# Patient Record
Sex: Male | Born: 1980
Health system: Southern US, Community
[De-identification: ages and names within clinical notes are randomized; demographics above are authoritative.]

## PROBLEM LIST (undated history)

## (undated) HISTORY — PX: NO PAST SURGERIES: SHX2092

## (undated) HISTORY — PX: VASECTOMY: SHX75

---

## 2008-01-04 ENCOUNTER — Ambulatory Visit: Payer: Self-pay | Admitting: Family Medicine

## 2008-01-04 DIAGNOSIS — J069 Acute upper respiratory infection, unspecified: Secondary | ICD-10-CM | POA: Insufficient documentation

## 2008-01-04 DIAGNOSIS — R5383 Other fatigue: Secondary | ICD-10-CM

## 2008-01-04 DIAGNOSIS — R05 Cough: Secondary | ICD-10-CM

## 2008-01-04 DIAGNOSIS — R5381 Other malaise: Secondary | ICD-10-CM

## 2008-01-05 ENCOUNTER — Encounter: Payer: Self-pay | Admitting: Family Medicine

## 2008-01-05 DIAGNOSIS — E291 Testicular hypofunction: Secondary | ICD-10-CM | POA: Insufficient documentation

## 2008-01-05 LAB — CONVERTED CEMR LAB
Basophils Relative: 0 % (ref 0–1)
Eosinophils Absolute: 0 10*3/uL (ref 0.0–0.7)
Eosinophils Relative: 1 % (ref 0–5)
HCT: 47 % (ref 39.0–52.0)
Hemoglobin: 15.5 g/dL (ref 13.0–17.0)
MCHC: 33 g/dL (ref 30.0–36.0)
MCV: 91.3 fL (ref 78.0–100.0)
Monocytes Absolute: 0.8 10*3/uL (ref 0.1–1.0)
Monocytes Relative: 13 % — ABNORMAL HIGH (ref 3–12)
Neutrophils Relative %: 70 % (ref 43–77)
RBC: 5.15 M/uL (ref 4.22–5.81)

## 2008-05-14 ENCOUNTER — Telehealth: Payer: Self-pay | Admitting: Family Medicine

## 2008-05-27 ENCOUNTER — Emergency Department (HOSPITAL_COMMUNITY): Admission: EM | Admit: 2008-05-27 | Discharge: 2008-05-27 | Payer: Self-pay | Admitting: Family Medicine

## 2008-12-13 ENCOUNTER — Ambulatory Visit: Payer: Self-pay | Admitting: Family Medicine

## 2008-12-13 DIAGNOSIS — B350 Tinea barbae and tinea capitis: Secondary | ICD-10-CM | POA: Insufficient documentation

## 2008-12-14 LAB — CONVERTED CEMR LAB
ALT: 26 units/L (ref 0–53)
Albumin: 4.6 g/dL (ref 3.5–5.2)
CO2: 24 meq/L (ref 19–32)
Chloride: 104 meq/L (ref 96–112)
Cholesterol: 145 mg/dL (ref 0–200)
Glucose, Bld: 85 mg/dL (ref 70–99)
Potassium: 4.3 meq/L (ref 3.5–5.3)
Sodium: 140 meq/L (ref 135–145)
Testosterone-% Free: 2.8 % (ref 1.6–2.9)
Total Protein: 8.1 g/dL (ref 6.0–8.3)
Triglycerides: 77 mg/dL (ref <150)

## 2009-01-22 ENCOUNTER — Ambulatory Visit: Payer: Self-pay | Admitting: Family Medicine

## 2009-01-22 DIAGNOSIS — R300 Dysuria: Secondary | ICD-10-CM

## 2009-01-22 LAB — CONVERTED CEMR LAB
Glucose, Urine, Semiquant: NEGATIVE
Ketones, urine, test strip: NEGATIVE
Protein, U semiquant: NEGATIVE
Specific Gravity, Urine: 1.025
Urobilinogen, UA: 0.2
WBC Urine, dipstick: NEGATIVE

## 2010-09-15 ENCOUNTER — Telehealth: Payer: Self-pay | Admitting: Family Medicine

## 2010-09-17 ENCOUNTER — Encounter: Payer: Self-pay | Admitting: Family Medicine

## 2010-09-18 ENCOUNTER — Ambulatory Visit: Payer: Self-pay | Admitting: Family Medicine

## 2010-09-25 NOTE — Progress Notes (Signed)
  Phone Note Call from Patient   Caller: Spouse Call For: [phone] Summary of Call: wife left message on vm stating pt has has chest pain on and off at least once a week for one month.Pt states pt is not currently having any chest pain and is fine and at work right now. Initial call taken by: Avon Gully CMA, Duncan Dull),  September 15, 2010 4:28 PM  Follow-up for Phone Call        called wife back and there was no answer but left a message on wifes vm stating if chest pain occurs before appt time of if new sxs developed and /or accompinied by chest pain then to go to ED. Follow-up by: Avon Gully CMA, Duncan Dull),  September 15, 2010 4:28 PM

## 2010-11-18 ENCOUNTER — Ambulatory Visit (INDEPENDENT_AMBULATORY_CARE_PROVIDER_SITE_OTHER): Payer: 59 | Admitting: Family Medicine

## 2010-11-18 ENCOUNTER — Encounter: Payer: Self-pay | Admitting: Family Medicine

## 2010-11-18 VITALS — BP 122/70 | HR 92 | Temp 98.4°F | Ht 69.25 in | Wt 189.0 lb

## 2010-11-18 DIAGNOSIS — J329 Chronic sinusitis, unspecified: Secondary | ICD-10-CM

## 2010-11-18 DIAGNOSIS — J029 Acute pharyngitis, unspecified: Secondary | ICD-10-CM

## 2010-11-18 LAB — POCT RAPID STREP A (OFFICE): Rapid Strep A Screen: NEGATIVE

## 2010-11-18 NOTE — Progress Notes (Signed)
  Subjective:    Patient ID: John Steele, male    DOB: 26-Apr-1981, 30 y.o.   MRN: [id number]  HPI  Sinsus pressure and pain. Slight cough. Throat feels tight in the AM. Today actually feels a little better. Sxs for about 4 days. Frontal HA.  Nose sxs are better. Nasal congestion is better.  Had a fever yesterday to 102. No fever today. Took IBU and some tylenol as well.   Review of Systems     Objective:   Physical Exam  Constitutional: He appears well-developed and well-nourished.  HENT:  Head: Normocephalic and atraumatic.  Right Ear: External ear normal.  Left Ear: External ear normal.  Nose: Nose normal.  Mouth/Throat: Oropharynx is clear and moist.       White spots on the right tonsil  Eyes: Conjunctivae are normal.       PER  Neck: Neck supple. No thyromegaly present.  Cardiovascular: Normal rate, regular rhythm and normal heart sounds.   Pulmonary/Chest: Effort normal and breath sounds normal.  Lymphadenopathy:    He has no cervical adenopathy.  Skin: Skin is warm and dry.  Psychiatric: He has a normal mood and affect.          Assessment & Plan:  URI - rapdi strep neg. Likely viral. Call if not better in 1 weeks.  Call if suddenly gets worse or fever recurs  Symptomatic care.

## 2011-10-22 ENCOUNTER — Ambulatory Visit (INDEPENDENT_AMBULATORY_CARE_PROVIDER_SITE_OTHER): Payer: 59 | Admitting: Family Medicine

## 2011-10-22 ENCOUNTER — Encounter: Payer: Self-pay | Admitting: Family Medicine

## 2011-10-22 DIAGNOSIS — K649 Unspecified hemorrhoids: Secondary | ICD-10-CM | POA: Insufficient documentation

## 2011-10-22 MED ORDER — HYDROCORTISONE 2.5 % RE CREA: TOPICAL_CREAM | Freq: Two times a day (BID) | RECTAL | Status: AC

## 2011-10-22 NOTE — Progress Notes (Signed)
  Subjective:    Patient ID: John Steele, male    DOB: 1980-07-07, 31 y.o.   MRN: [id number]  HPI Thinks has a hemorrhoid. Was painful about 8 days ago. Tried some IBU and topical cream.  He took ibuprofen was somewhat helpful but the over-the-counter hemorrhoid cream was not. He says he went on to [facility]. and started increasing the fiber and water intake in his diet.  Says it is getting better today.  Has noticed some bleeding from it.  No constipation but says did have to strain a little more last week.    Review of Systems     Objective:   Physical Exam  Constitutional: He is oriented to person, place, and time. He appears well-developed and well-nourished.  Genitourinary:       Rectum with normal tone. He does have a hemorrhoid at the 12 o'clock position that is mildy inflammed with a scab on it.  No sign of clot.  No rectal masses.  Hemeoccult is neg.   Neurological: He is alert and oriented to person, place, and time.  Psychiatric: He has a normal mood and affect. His behavior is normal.       Assessment & Plan:  Hemorrhoid - discussed working on keeping the stool soft to avoid straining avoid constipation. Also sometimes diarrhea can aggravate hemorrhoids. He can continue the topical cream, Anusol sent to pharm,  until it completely improved. If he has any recurrence but he is unable to control in his own home he was welcome to come back in. If he needs to he can also take a stool softener it is just increasing the fiber in his diet increasing water intake is not relieve his symptoms.

## 2011-10-22 NOTE — Patient Instructions (Signed)

## 2012-08-26 ENCOUNTER — Encounter: Payer: 59 | Admitting: Family Medicine

## 2012-08-31 ENCOUNTER — Encounter: Payer: Self-pay | Admitting: Family Medicine

## 2012-08-31 ENCOUNTER — Ambulatory Visit (INDEPENDENT_AMBULATORY_CARE_PROVIDER_SITE_OTHER): Payer: 59 | Admitting: Family Medicine

## 2012-08-31 VITALS — BP 120/74 | HR 70 | Ht 69.6 in | Wt 195.0 lb

## 2012-08-31 DIAGNOSIS — Z0000 Encounter for general adult medical examination without abnormal findings: Secondary | ICD-10-CM

## 2012-08-31 DIAGNOSIS — E291 Testicular hypofunction: Secondary | ICD-10-CM

## 2012-08-31 MED ORDER — TETANUS-DIPHTH-ACELL PERTUSSIS 5-2.5-18.5 LF-MCG/0.5 IM SUSP: 0.5000 mL | Freq: Once | INTRAMUSCULAR | Status: DC

## 2012-08-31 NOTE — Progress Notes (Signed)
  Subjective:    Patient ID: John Steele, male    DOB: [birth year], 32 y.o.   MRN: [id number]  HPI Here for complete physical today. He has no specific concerns. The he would like his testosterone rechecked. We tested it a couple years ago and it was borderline low. He saw a commercial and felt he was having a coupl of symptoms that might be related to his testosterone. He does have some keloids in his schedule an appointment with one symptomatology.   Review of Systems Comprehensive review of systems is negative. BP 120/74  Pulse 70  Ht 5' 9.6" (1.768 m)  Wt 195 lb (88.451 kg)  BMI 28.3 kg/m2    No Known Allergies  No past medical history on file.  Past Surgical History  Procedure Laterality Date  . No past surgeries      History   Social History  . Marital Status: Married    Spouse Name: N/A    Number of Children: N/A  . Years of Education: N/A   Occupational History  . Adult nurse   Social History Main Topics  . Smoking status: Light Tobacco Smoker    Types: Cigars  . Smokeless tobacco: Not on file  . Alcohol Use: 3.6 oz/week    6 Cans of beer per week  . Drug Use: Not on file  . Sexually Active: Yes -- Male partner(s)   Other Topics Concern  . Not on file   Social History Narrative   No regular exercise.     Family History  Problem Relation Age of Onset  . Hypertension Mother   . Diabetes Father     No outpatient encounter prescriptions on file as of 08/31/2012.   No facility-administered encounter medications on file as of 08/31/2012.          Objective:   Physical Exam  Constitutional: He is oriented to person, place, and time. He appears well-developed and well-nourished.  HENT:  Head: Normocephalic and atraumatic.  Right Ear: External ear normal.  Left Ear: External ear normal.  Nose: Nose normal.  Mouth/Throat: Oropharynx is clear and moist.  Eyes: Conjunctivae and EOM are normal. Pupils are equal, round, and reactive  to light.  Neck: Normal range of motion. Neck supple. No thyromegaly present.  Cardiovascular: Normal rate, regular rhythm, normal heart sounds and intact distal pulses.   Pulmonary/Chest: Effort normal and breath sounds normal.  Abdominal: Soft. Bowel sounds are normal. He exhibits no distension and no mass. There is no tenderness. There is no rebound and no guarding.  Musculoskeletal: Normal range of motion.  Lymphadenopathy:    He has no cervical adenopathy.  Neurological: He is alert and oriented to person, place, and time. He has normal reflexes.  Skin: Skin is warm and dry.  Psychiatric: He has a normal mood and affect. His behavior is normal. Judgment and thought content normal.          Assessment & Plan:  CPE -  Overall he is doing well. Due for screening labwork including CMP, lipid panel. We'll recheck testosterone level since previous levels were borderline. Encourage regular exercise and healthy diet. Tdap updated today. He did not get a flu shot this year. Blood pressure looks fantastic today. Followup in one year.

## 2012-08-31 NOTE — Patient Instructions (Addendum)
Keep up a regular exercise program and make sure you are eating a healthy diet Try to eat 4 servings of dairy a day, or if you are lactose intolerant take a calcium with vitamin D daily.  Your vaccines are up to date.   

## 2013-09-13 ENCOUNTER — Ambulatory Visit (INDEPENDENT_AMBULATORY_CARE_PROVIDER_SITE_OTHER): Payer: 59 | Admitting: Family Medicine

## 2013-09-13 ENCOUNTER — Encounter: Payer: Self-pay | Admitting: Family Medicine

## 2013-09-13 VITALS — BP 133/78 | HR 70 | Ht 70.0 in | Wt 196.0 lb

## 2013-09-13 DIAGNOSIS — Z0000 Encounter for general adult medical examination without abnormal findings: Secondary | ICD-10-CM

## 2013-09-13 DIAGNOSIS — Z1211 Encounter for screening for malignant neoplasm of colon: Secondary | ICD-10-CM

## 2013-09-13 DIAGNOSIS — Z8042 Family history of malignant neoplasm of prostate: Secondary | ICD-10-CM | POA: Insufficient documentation

## 2013-09-13 LAB — POC HEMOCCULT BLD/STL (OFFICE/1-CARD/DIAGNOSTIC): Fecal Occult Blood, POC: NEGATIVE

## 2013-09-13 NOTE — Progress Notes (Signed)
   Subjective:    Patient ID: John Steele, male    DOB: 1981/01/11, 33 y.o.   MRN: [id number]  HPI Here for CPE- Not wanting to quit smoking.  No regular exercise.  He would like to have a PSA and his prostate examined because his father had prostate cancer at 5045. He's completely asymptomatic at this time. He's not having any urinary symptoms.    Review of Systems Comprehensive review of systems is negative.  BP 133/78  Pulse 70  Ht 5\' 10"  (1.778 m)  Wt 196 lb (88.905 kg)  BMI 28.12 kg/m2    No Known Allergies  History reviewed. No pertinent past medical history.  Past Surgical History  Procedure Laterality Date  . No past surgeries      History   Social History  . Marital Status: Married    Spouse Name: N/A    Number of Children: N/A  . 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 Smoking status: Light Tobacco Smoker    Types: Cigars  . Smokeless tobacco: Not on file  . Alcohol Use: 3.6 oz/week    6 Cans of beer per week  . Drug Use: Not on file  . Sexual Activity: Yes    Partners: Female   Other Topics Concern  . Not on file   Social History Narrative   No regular exercise.     Family History  Problem Relation Age of Onset  . Hypertension Mother   . Diabetes Father   . Prostate cancer Father [phone]    No outpatient encounter prescriptions on file as of 09/13/2013.          Objective:   Physical Exam  Constitutional: He is oriented to person, place, and time. He appears well-developed and well-nourished.  HENT:  Head: Normocephalic and atraumatic.  Right Ear: External ear normal.  Left Ear: External ear normal.  Nose: Nose normal.  Mouth/Throat: Oropharynx is clear and moist.  Eyes: Conjunctivae and EOM are normal. Pupils are equal, round, and reactive to light.  Neck: Normal range of motion. Neck supple. No thyromegaly present.  Cardiovascular: Normal rate, regular rhythm,  normal heart sounds and intact distal pulses.   Pulmonary/Chest: Effort normal and breath sounds normal.  Abdominal: Soft. Bowel sounds are normal. He exhibits no distension and no mass. There is no tenderness. There is no rebound and no guarding.  Musculoskeletal: Normal range of motion.  Lymphadenopathy:    He has no cervical adenopathy.  Neurological: He is alert and oriented to person, place, and time. He has normal reflexes.  Skin: Skin is warm and dry.  Psychiatric: He has a normal mood and affect. His behavior is normal. Judgment and thought content normal.          Assessment & Plan:  CPE Keep up a regular exercise program and make sure you are eating a healthy diet Try to eat 4 servings of dairy a day, or if you are lactose intolerant take a calcium with vitamin D daily.  Your vaccines are up to date.   Smoker - offered cessaion counseling. He's not just in quitting smoking at this time. He also declines a pneumonia vaccine.  Family history of prostate cancer-will check PSA.

## 2013-09-13 NOTE — Patient Instructions (Signed)
Keep up a regular exercise program and make sure you are eating a healthy diet Try to eat 4 servings of dairy a day, or if you are lactose intolerant take a calcium with vitamin D daily.  Your vaccines are up to date.   

## 2014-01-17 ENCOUNTER — Other Ambulatory Visit: Payer: Self-pay | Admitting: *Deleted

## 2014-01-17 DIAGNOSIS — Z0000 Encounter for general adult medical examination without abnormal findings: Secondary | ICD-10-CM

## 2014-01-17 DIAGNOSIS — E291 Testicular hypofunction: Secondary | ICD-10-CM

## 2014-02-27 ENCOUNTER — Encounter: Payer: Self-pay | Admitting: Sports Medicine

## 2014-02-27 ENCOUNTER — Ambulatory Visit (INDEPENDENT_AMBULATORY_CARE_PROVIDER_SITE_OTHER): Payer: 59 | Admitting: Sports Medicine

## 2014-02-27 ENCOUNTER — Ambulatory Visit (INDEPENDENT_AMBULATORY_CARE_PROVIDER_SITE_OTHER): Payer: 59

## 2014-02-27 VITALS — BP 124/64 | HR 71 | Ht 70.0 in | Wt 193.0 lb

## 2014-02-27 DIAGNOSIS — M545 Low back pain, unspecified: Secondary | ICD-10-CM

## 2014-02-27 DIAGNOSIS — M5136 Other intervertebral disc degeneration, lumbar region: Secondary | ICD-10-CM | POA: Insufficient documentation

## 2014-02-27 DIAGNOSIS — S335XXA Sprain of ligaments of lumbar spine, initial encounter: Secondary | ICD-10-CM

## 2014-02-27 DIAGNOSIS — S39012A Strain of muscle, fascia and tendon of lower back, initial encounter: Secondary | ICD-10-CM

## 2014-02-27 DIAGNOSIS — M51369 Other intervertebral disc degeneration, lumbar region without mention of lumbar back pain or lower extremity pain: Secondary | ICD-10-CM | POA: Insufficient documentation

## 2014-02-27 MED ORDER — MELOXICAM 15 MG PO TABS: ORAL_TABLET | ORAL | Status: DC
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
## 2014-03-09 ENCOUNTER — Ambulatory Visit (INDEPENDENT_AMBULATORY_CARE_PROVIDER_SITE_OTHER): Payer: 59 | Admitting: Family Medicine

## 2014-03-09 ENCOUNTER — Encounter: Payer: Self-pay | Admitting: Family Medicine

## 2014-03-09 VITALS — BP 138/84 | HR 104 | Temp 99.5°F | Ht 70.0 in | Wt 190.0 lb

## 2014-03-09 DIAGNOSIS — R05 Cough: Secondary | ICD-10-CM

## 2014-03-09 DIAGNOSIS — J019 Acute sinusitis, unspecified: Secondary | ICD-10-CM

## 2014-03-09 DIAGNOSIS — R059 Cough, unspecified: Secondary | ICD-10-CM

## 2014-03-09 MED ORDER — AMOXICILLIN-POT CLAVULANATE 875-125 MG PO TABS: 1.0000 | ORAL_TABLET | Freq: Two times a day (BID) | ORAL | Status: DC

## 2014-03-09 MED ORDER — ALBUTEROL SULFATE (2.5 MG/3ML) 0.083% IN NEBU: 2.5000 mg | INHALATION_SOLUTION | Freq: Once | RESPIRATORY_TRACT | Status: DC

## 2014-03-09 NOTE — Progress Notes (Signed)
   Subjective:    Patient ID: John Steele, male    DOB: Oct 09, 1980, 33 y.o.   MRN: [id number]  HPI Sinus pain and pressure around the right eye x 3 days.  sxs began [place] he has been using mucinex and saline rinse not helping he reports feeling tightness in chest and cough.  No fever.  + chills.  Hx of asthma as a child.    Has felt SOB.  Cough is dry. Mild ST.     Review of Systems     Objective:   Physical Exam  Constitutional: He is oriented to person, place, and time. He appears well-developed and well-nourished.  HENT:  Head: Normocephalic and atraumatic.  Right Ear: External ear normal.  Left Ear: External ear normal.  Nose: Nose normal.  Mouth/Throat: Oropharynx is clear and moist.  TMs and canals are clear.   Eyes: Conjunctivae and EOM are normal. Pupils are equal, round, and reactive to light.  Neck: Neck supple. No thyromegaly present.  Cardiovascular: Normal rate and normal heart sounds.   Pulmonary/Chest: Effort normal and breath sounds normal.  Lymphadenopathy:    He has no cervical adenopathy.  Neurological: He is alert and oriented to person, place, and time.  Skin: Skin is warm and dry.  Psychiatric: He has a normal mood and affect.          Assessment & Plan:  Acute sinusitis-will treat with Augmentin. Call if not significantly better in one week. Work on hydration and symptomatic care.

## 2015-04-02 ENCOUNTER — Ambulatory Visit (INDEPENDENT_AMBULATORY_CARE_PROVIDER_SITE_OTHER): Payer: BLUE CROSS/BLUE SHIELD | Admitting: Family Medicine

## 2015-04-02 ENCOUNTER — Encounter: Payer: Self-pay | Admitting: Family Medicine

## 2015-04-02 VITALS — BP 133/74 | HR 74 | Temp 98.6°F | Ht 70.0 in | Wt 206.0 lb

## 2015-04-02 DIAGNOSIS — J029 Acute pharyngitis, unspecified: Secondary | ICD-10-CM

## 2015-04-02 DIAGNOSIS — J069 Acute upper respiratory infection, unspecified: Secondary | ICD-10-CM | POA: Diagnosis not present

## 2015-04-02 LAB — POCT RAPID STREP A (OFFICE): RAPID STREP A SCREEN: NEGATIVE

## 2015-04-02 NOTE — Progress Notes (Signed)
   Subjective:    Patient ID: John Steele, male    DOB: Nov 19, 1980, 34 y.o.   MRN: [id number]  HPI Sinus congestion and cough x 1 week. Says took some old amoxicillin.  Throat was really scratchey over the weekend.  Using Ricola and mucinex. Says woke up feeling like it was hard to talk.  No fever, chills. He is very worried about strep throat.    Review of Systems     Objective:   Physical Exam  Constitutional: He is oriented to person, place, and time. He appears well-developed and well-nourished.  HENT:  Head: Normocephalic and atraumatic.  Right Ear: External ear normal.  Left Ear: External ear normal.  Nose: Nose normal.  Mouth/Throat: Oropharynx is clear and moist.  TMs and canals are clear.   Eyes: Conjunctivae and EOM are normal. Pupils are equal, round, and reactive to light.  Neck: Neck supple. No thyromegaly present.  Cardiovascular: Normal rate and normal heart sounds.   Pulmonary/Chest: Effort normal and breath sounds normal.  Lymphadenopathy:    He has no cervical adenopathy.  Neurological: He is alert and oriented to person, place, and time.  Skin: Skin is warm and dry.  Psychiatric: He has a normal mood and affect.          Assessment & Plan:  URI - likely viral. Call if not better in 3-4 days. Strep is neg. recommend symptomatic care.

## 2015-04-02 NOTE — Patient Instructions (Signed)
Recommend salt water gargles to 3 times a day. Also recommend warm herbal tea with honey unit to help coat the throat. Using a humidifier at night can help as well as specially on keeping the throat from getting dry and irritated. Call if not feeling some better in the next 3-4 days.

## 2015-04-17 ENCOUNTER — Encounter: Payer: Self-pay | Admitting: Family Medicine

## 2015-04-17 ENCOUNTER — Ambulatory Visit (INDEPENDENT_AMBULATORY_CARE_PROVIDER_SITE_OTHER): Payer: BLUE CROSS/BLUE SHIELD | Admitting: Family Medicine

## 2015-04-17 VITALS — BP 148/83 | HR 78 | Temp 98.5°F | Resp 18 | Wt 204.8 lb

## 2015-04-17 DIAGNOSIS — Z0000 Encounter for general adult medical examination without abnormal findings: Secondary | ICD-10-CM | POA: Diagnosis not present

## 2015-04-17 DIAGNOSIS — E291 Testicular hypofunction: Secondary | ICD-10-CM | POA: Diagnosis not present

## 2015-04-17 NOTE — Progress Notes (Signed)
   Subjective:    Patient ID: John Steele, male    DOB: 04/09/1981, 34 y.o.   MRN: [id number]  HPI Here for CPE today. No specific concerns or complaints.    Review of Systems Comprehensive ROS is negative.   BP 148/83 mmHg  Pulse 78  Temp(Src) 98.5 F (36.9 C) (Oral)  Resp 18  Wt 204 lb 12.8 oz (92.897 kg)  SpO2 98%    No Known Allergies  No past medical history on file.  Past Surgical History  Procedure Laterality Date  . No past surgeries      Social History   Social History  . Marital Status: Married    Spouse Name: N/A  . Number of Children: N/A  . 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 Smoking status: Former Smoker    Types: Cigars  . Smokeless tobacco: Not on file  . Alcohol Use: 3.6 oz/week    6 Cans of beer per week  . Drug Use: Not on file  . Sexual Activity:    Partners: Female   Other Topics Concern  . Not on file   Social History Narrative   No regular exercise.     Family History  Problem Relation Age of Onset  . Hypertension Mother   . Diabetes Father   . Prostate cancer Father [phone]    No outpatient encounter prescriptions on file as of 04/17/2015.   No facility-administered encounter medications on file as of 04/17/2015.          Objective:   Physical Exam  Constitutional: He is oriented to person, place, and time. He appears well-developed and well-nourished.  HENT:  Head: Normocephalic and atraumatic.  Right Ear: External ear normal.  Left Ear: External ear normal.  Nose: Nose normal.  Mouth/Throat: Oropharynx is clear and moist.  Eyes: Conjunctivae and EOM are normal. Pupils are equal, round, and reactive to light.  Neck: Normal range of motion. Neck supple. No thyromegaly present.  Cardiovascular: Normal rate, regular rhythm, normal heart sounds and intact distal pulses.   Pulmonary/Chest: Effort normal and breath sounds normal.  Abdominal:  Soft. Bowel sounds are normal. He exhibits no distension and no mass. There is no tenderness. There is no rebound and no guarding.  Musculoskeletal: Normal range of motion.  Lymphadenopathy:    He has no cervical adenopathy.  Neurological: He is alert and oriented to person, place, and time. He has normal reflexes.  Skin: Skin is warm and dry.  Psychiatric: He has a normal mood and affect. His behavior is normal. Judgment and thought content normal.       Assessment & Plan:  CPE  Keep up a regular exercise program and make sure you are eating a healthy diet Try to eat 4 servings of dairy a day, or if you are lactose intolerant take a calcium with vitamin D daily.  Your vaccines are up to date.

## 2015-04-17 NOTE — Patient Instructions (Signed)
Keep up a regular exercise program and make sure you are eating a healthy diet Try to eat 4 servings of dairy a day, or if you are lactose intolerant take a calcium with vitamin D daily.  Your vaccines are up to date.   

## 2015-04-18 LAB — COMPLETE METABOLIC PANEL WITH GFR
ALT: 53 U/L — AB (ref 9–46)
AST: 21 U/L (ref 10–40)
Albumin: 4.7 g/dL (ref 3.6–5.1)
Alkaline Phosphatase: 68 U/L (ref 40–115)
BUN: 12 mg/dL (ref 7–25)
CALCIUM: 9.8 mg/dL (ref 8.6–10.3)
CHLORIDE: 102 mmol/L (ref 98–110)
CO2: 27 mmol/L (ref 20–31)
CREATININE: 0.99 mg/dL (ref 0.60–1.35)
GFR, Est African American: >89 mL/min (ref >=60)
GFR, Est Non African American: >89 mL/min (ref >=60)
Glucose, Bld: 91 mg/dL (ref 65–99)
Potassium: 4.3 mmol/L (ref 3.5–5.3)
Sodium: 137 mmol/L (ref 135–146)
TOTAL PROTEIN: 7.9 g/dL (ref 6.1–8.1)
Total Bilirubin: 0.6 mg/dL (ref 0.2–1.2)

## 2015-04-18 LAB — LIPID PANEL
CHOLESTEROL: 182 mg/dL (ref 125–200)
HDL: 43 mg/dL (ref >=40)
LDL CALC: 115 mg/dL (ref <130)
TRIGLYCERIDES: 118 mg/dL (ref <150)
Total CHOL/HDL Ratio: 4.2 Ratio (ref <=5.0)
VLDL: 24 mg/dL (ref <30)

## 2015-04-18 LAB — TESTOSTERONE: TESTOSTERONE: 385 ng/dL (ref 300–890)

## 2015-04-25 ENCOUNTER — Other Ambulatory Visit: Payer: Self-pay | Admitting: *Deleted

## 2015-04-25 DIAGNOSIS — R748 Abnormal levels of other serum enzymes: Secondary | ICD-10-CM

## 2015-07-02 ENCOUNTER — Telehealth: Payer: Self-pay

## 2015-07-02 NOTE — Telephone Encounter (Signed)
Patient complains of sore throat.  No problems swallowing, denies fever.  He says he has tried OTC throat lozenges, gargleing salt water and ibuprofen.  Advised patient he could try an antihistamine but otherwise should be evaluated by a provider.  He says he will give it another day or so and if not better he will schedule an appointment.

## 2016-04-15 ENCOUNTER — Encounter: Payer: BLUE CROSS/BLUE SHIELD | Admitting: Family Medicine

## 2016-04-17 ENCOUNTER — Encounter: Payer: BLUE CROSS/BLUE SHIELD | Admitting: Family Medicine

## 2016-07-22 ENCOUNTER — Ambulatory Visit (INDEPENDENT_AMBULATORY_CARE_PROVIDER_SITE_OTHER): Payer: BLUE CROSS/BLUE SHIELD | Admitting: Sports Medicine

## 2016-07-22 ENCOUNTER — Ambulatory Visit (INDEPENDENT_AMBULATORY_CARE_PROVIDER_SITE_OTHER): Payer: BLUE CROSS/BLUE SHIELD

## 2016-07-22 DIAGNOSIS — M51369 Other intervertebral disc degeneration, lumbar region without mention of lumbar back pain or lower extremity pain: Secondary | ICD-10-CM

## 2016-07-22 DIAGNOSIS — M5136 Other intervertebral disc degeneration, lumbar region: Secondary | ICD-10-CM

## 2016-07-22 DIAGNOSIS — G8929 Other chronic pain: Secondary | ICD-10-CM

## 2016-07-22 DIAGNOSIS — M545 Low back pain: Secondary | ICD-10-CM | POA: Diagnosis not present

## 2016-07-22 MED ORDER — MELOXICAM 15 MG PO TABS: ORAL_TABLET | ORAL | 3 refills | Status: DC
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
## 2016-07-22 NOTE — Assessment & Plan Note (Signed)
Recurrence of intermittent back pain now for a year and a half despite conservative measures. New set of x-rays, meloxicam, physical therapy, MRI. Return in one month as needed.

## 2016-07-27 ENCOUNTER — Ambulatory Visit (INDEPENDENT_AMBULATORY_CARE_PROVIDER_SITE_OTHER): Payer: BLUE CROSS/BLUE SHIELD

## 2016-07-27 DIAGNOSIS — M5136 Other intervertebral disc degeneration, lumbar region: Secondary | ICD-10-CM

## 2016-07-27 DIAGNOSIS — M5126 Other intervertebral disc displacement, lumbar region: Secondary | ICD-10-CM

## 2016-08-03 ENCOUNTER — Encounter: Payer: Self-pay | Admitting: Rehabilitative and Restorative Service Providers"

## 2016-08-03 ENCOUNTER — Ambulatory Visit (INDEPENDENT_AMBULATORY_CARE_PROVIDER_SITE_OTHER): Payer: BLUE CROSS/BLUE SHIELD | Admitting: Rehabilitative and Restorative Service Providers"

## 2016-08-03 DIAGNOSIS — M545 Low back pain, unspecified: Secondary | ICD-10-CM

## 2016-08-03 DIAGNOSIS — R29898 Other symptoms and signs involving the musculoskeletal system: Secondary | ICD-10-CM

## 2016-08-03 NOTE — Patient Instructions (Signed)
Abdominal Bracing With Pelvic Floor (Hook-Lying)    With neutral spine, tighten pelvic floor and abdominals sucking belly button to back bone; tighten muscles in low back at waist. Hold 10 sec x 10  Do _several__ times a day. Progress to do this in siting; standing; walking and with functional activities    Trunk: Prone Extension (Press-Ups)    Lie on stomach on firm, flat surface. Relax bottom and legs. Raise chest in air with elbows straight. Keep hips flat on surface, sag stomach. Hold __2-3__ seconds. Repeat _10___ times. Do _2___ sessions per day. CAUTION: Movement should be gentle and slow.   HIP: Hamstrings - Supine   Place strap around foot. Raise leg up, keeping knee straight.  Bend opposite knee to protect back if indicated. Hold 30 seconds. 3 reps per set, 2-3 sets per day     Outer Hip Stretch: Reclined IT Band Stretch (Strap)   Strap around one foot, pull leg across body until you feel a pull or stretch, with shoulders on mat. Hold for 30 seconds. Repeat 3 times each leg. 2-3 times/day.  Piriformis Stretch   Lying on back, pull right knee toward opposite shoulder. Hold 30 seconds. Repeat 3 times. Do 2-3 sessions per day.   Quads / HF, Supine   Lie near edge of bed, pull both knees up toward chest. Hold one knee as you drop the other leg off the edge of the bed.  Relax hanging knee/can bend knee back if indicated. Hold 30 seconds. Repeat 3 times per session. Do 2-3 sessions per day.    Quads / HF, Prone   Lie face down. Grasp one ankle with same-side hand. Use towel if needed to reach. Gently pull foot toward buttock.  Hold 30 seconds. Repeat 3 times per session. Do 2-3 sessions per day.   Sleeping on Back  Place pillow under knees. A pillow with cervical support and a roll around waist are also helpful. Copyright  VHI. All rights reserved.  Sleeping on Side Place pillow between knees. Use cervical support under neck and a roll around waist as  needed. Copyright  VHI. All rights reserved.   Sleeping on Stomach   If this is the only desirable sleeping position, place pillow under lower legs, and under stomach or chest as needed.  Posture - Sitting   Sit upright, head facing forward. Try using a roll to support lower back. Keep shoulders relaxed, and avoid rounded back. Keep hips level with knees. Avoid crossing legs for long periods. Stand to Sit / Sit to Stand   To sit: Bend knees to lower self onto front edge of chair, then scoot back on seat. To stand: Reverse sequence by placing one foot forward, and scoot to front of seat. Use rocking motion to stand up.   Work Height and Reach  Ideal work height is no more than 2 to 4 inches below elbow level when standing, and at elbow level when sitting. Reaching should be limited to arm's length, with elbows slightly bent.  Bending  Bend at hips and knees, not back. Keep feet shoulder-width apart.    Posture - Standing   Good posture is important. Avoid slouching and forward head thrust. Maintain curve in low back and align ears over shoul- ders, hips over ankles.  Alternating Positions   Alternate tasks and change positions frequently to reduce fatigue and muscle tension. Take rest breaks. Computer Work   Position work to Art gallery manager. Use proper work and seat height.  Keep shoulders back and down, wrists straight, and elbows at right angles. Use chair that provides full back support. Add footrest and lumbar roll as needed.  Getting Into / Out of Car  Lower self onto seat, scoot back, then bring in one leg at a time. Reverse sequence to get out.  Dressing  Lie on back to pull socks or slacks over feet, or sit and bend leg while keeping back straight.    Housework - Sink  Place one foot on ledge of cabinet under sink when standing at sink for prolonged periods.   Pushing / Pulling  Pushing is preferable to pulling. Keep back in proper alignment, and use leg  muscles to do the work.  Deep Squat   Squat and lift with both arms held against upper trunk. Tighten stomach muscles without holding breath. Use smooth movements to avoid jerking.  Avoid Twisting   Avoid twisting or bending back. Pivot around using foot movements, and bend at knees if needed when reaching for articles.  Carrying Luggage   Distribute weight evenly on both sides. Use a cart whenever possible. Do not twist trunk. Move body as a unit.   Lifting Principles .Maintain proper posture and head alignment. .Slide object as close as possible before lifting. .Move obstacles out of the way. .Test before lifting; ask for help if too heavy. .Tighten stomach muscles without holding breath. .Use smooth movements; do not jerk. .Use legs to do the work, and pivot with feet. .Distribute the work load symmetrically and close to the center of trunk. .Push instead of pull whenever possible.   Ask For Help   Ask for help and delegate to others when possible. Coordinate your movements when lifting together, and maintain the low back curve.  Log Roll   Lying on back, bend left knee and place left arm across chest. Roll all in one movement to the right. Reverse to roll to the left. Always move as one unit. Housework - Sweeping  Use long-handled equipment to avoid stooping.   Housework - Wiping  Position yourself as close as possible to reach work surface. Avoid straining your back.  Laundry - Unloading Wash   To unload small items at bottom of washer, lift leg opposite to arm being used to reach.  Gardening - Raking  Move close to area to be raked. Use arm movements to do the work. Keep back straight and avoid twisting.     Cart  When reaching into cart with one arm, lift opposite leg to keep back straight.   Getting Into / Out of Bed  Lower self to lie down on one side by raising legs and lowering head at the same time. Use arms to assist moving without twisting.  Bend both knees to roll onto back if desired. To sit up, start from lying on side, and use same move-ments in reverse. Housework - Vacuuming  Hold the vacuum with arm held at side. Step back and forth to move it, keeping head up. Avoid twisting.   Laundry - Armed forces training and education officer so that bending and twisting can be avoided.   Laundry - Unloading Dryer  Squat down to reach into clothes dryer or use a reacher.  Gardening - Weeding / Psychiatric nurse or Kneel. Knee pads may be helpful.                    TENS UNIT: This is helpful for muscle pain and spasm.  Search and Purchase a TENS 7000 2nd edition at www.tenspros.com. It should be less than $30.     TENS unit instructions: Do not shower or bathe with the unit on Turn the unit off before removing electrodes or batteries If the electrodes lose stickiness add a drop of water to the electrodes after they are disconnected from the unit and place on plastic sheet. If you continued to have difficulty, call the TENS unit company to purchase more electrodes. Do not apply lotion on the skin area prior to use. Make sure the skin is clean and dry as this will help prolong the life of the electrodes. After use, always check skin for unusual red areas, rash or other skin difficulties. If there are any skin problems, does not apply electrodes to the same area. Never remove the electrodes from the unit by pulling the wires. Do not use the TENS unit or electrodes other than as directed. Do not change electrode placement without consultating your therapist or physician. Keep 2 fingers with between each electrode.
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
## 2016-08-10 ENCOUNTER — Ambulatory Visit (INDEPENDENT_AMBULATORY_CARE_PROVIDER_SITE_OTHER): Payer: BLUE CROSS/BLUE SHIELD | Admitting: Rehabilitative and Restorative Service Providers"

## 2016-08-10 ENCOUNTER — Encounter: Payer: Self-pay | Admitting: Rehabilitative and Restorative Service Providers"

## 2016-08-10 DIAGNOSIS — R29898 Other symptoms and signs involving the musculoskeletal system: Secondary | ICD-10-CM | POA: Diagnosis not present

## 2016-08-10 DIAGNOSIS — M545 Low back pain, unspecified: Secondary | ICD-10-CM

## 2016-08-10 NOTE — Patient Instructions (Addendum)
Bridging    Slowly raise buttocks from floor, keeping core  Tight. 5-10 sec hold  Repeat __10__ times per set. Do __1-2__ sets per session. Do __1__ sessions per day.   Pelvic Press    Place hands under belly between navel and pubic bone, palms up. Feel pressure on hands. Increase pressure on hands by pressing pelvis down. This is NOT a pelvic tilt. Hold _5__ seconds. Relax. Repeat _10__ times.   Leg Lift: One-Leg    Press pelvis down. Keep knee straight; lengthen and lift one leg (from waist). Do not twist body. Keep other leg down. Hold _2-3__ seconds. Relax. Repeat 10 time. Repeat with other leg.   Shoulder Blade Squeeze: Arms at Sides    Arms at sides, parallel, elbows straight, palms up. Press pelvis down. Squeeze backbone with shoulder blades, raising front of shoulders, chest, and arms. Keep head and neck neutral. Hold _3-5__ seconds. Relax. Repeat _10__ times.

## 2016-08-10 NOTE — Therapy (Signed)
[facility] [address] [place], [place], [address] Phone: [phone]   Fax:  [phone]  Physical Therapy Treatment  Patient Details  Name: John Steele MRN: [id number] Date of Birth: 10-19-1980 Referring Provider: Dr Benjamin Stainhekkekandam  Encounter Date: 08/10/2016      PT End of Session - 08/10/16 1405    Visit Number 2   Number of Visits 12   Date for PT Re-Evaluation 09/14/16   PT Start Time 1404   PT Stop Time 1500   PT Time Calculation (min) 56 min   Activity Tolerance Patient tolerated treatment well      History reviewed. No pertinent past medical history.  Past Surgical History:  Procedure Laterality Date  . NO PAST SURGERIES      There were no vitals filed for this visit.      Subjective Assessment - 08/10/16 1406    Subjective Working on his exercises at home. The hamstring stretch is getting a litle easier.    Currently in Pain? Yes   Pain Score 3    Pain Location Back   Pain Orientation Lower  center    Pain Descriptors / Indicators Dull;Aching   Pain Type Chronic pain   Pain Onset More than a month ago   Pain Frequency Intermittent                         [facility] Adult PT Treatment/Exercise - 08/10/16 0001      Lumbar Exercises: Stretches   Passive Hamstring Stretch 3 reps;30 seconds   Hip Flexor Stretch 2 reps;30 seconds   Press Ups --  2-3 sec hold x 10    Quad Stretch 2 reps;30 seconds   ITB Stretch 2 reps;30 seconds   Piriformis Stretch 2 reps;30 seconds  PT assist for stretch      Lumbar Exercises: Supine   Ab Set --  3 part core 10 sec x 10    Bridge 10 reps  10 sec hold core engaged      Lumbar Exercises: Prone   Other Prone Lumbar Exercises prone press 5 sec x 10; prone alt hip ext 3 sec x 10      Moist Heat Therapy   Number Minutes Moist Heat 20 Minutes   Moist Heat Location Lumbar Spine     Electrical Stimulation   Electrical Stimulation Location  bilat lumbar paraspinals    Electrical Stimulation Action IFC   Electrical Stimulation Parameters to tolerance   Electrical Stimulation Goals Pain;Tone     Manual Therapy   Manual therapy comments pt prone    Joint Mobilization L2/3/4/5/S1 CPA and lateral mobs Grade II/III                PT Education - 08/10/16 1424    Education provided Yes   Education Details HEP   Person(s) Educated Patient   Methods Explanation;Demonstration;Tactile cues;Verbal cues;Handout   Comprehension Verbalized understanding;Returned demonstration;Verbal cues required;Tactile cues required             PT Long Term Goals - 08/10/16 1408      PT LONG TERM GOAL #1   Title Improve trunk mobility through spinal extension to 75% limitation 09/14/16   Time 6   Period Weeks   Status On-going     PT LONG TERM GOAL #2   Title Increase LE flexibility and tissue extensibility 09/14/16   Time 6   Period Weeks   Status On-going  PT LONG TERM GOAL #3   Title Patient reports increased sitting and standing tolerance 09/14/16   Time 6   Period Weeks   Status On-going     PT LONG TERM GOAL #4   Title Independent in HEP 09/14/16   Time 6   Period Weeks   Status On-going     PT LONG TERM GOAL #5   Title Improve FOTO to </= 19% limitation 09/14/16    Time 6   Period Weeks   Status On-going               Plan - 08/10/16 1408    Clinical Impression Statement Progressing well with lumbar stabilization and LE stretching. Added exercise without difficulty. Progressing toward stated goals of therapy.    Rehab Potential Good   PT Frequency 2x / week   PT Duration 6 weeks   PT Treatment/Interventions Patient/family education;ADLs/Self Care Home Management;Cryotherapy;Electrical Stimulation;Iontophoresis 4mg /ml Dexamethasone;Moist Heat;Traction;Ultrasound;Dry needling;Manual techniques;Therapeutic activities;Therapeutic exercise   PT Next Visit Plan progress with stretching; continue with core  stabilization; manual work/spinal mobs and modalities as indicated; progress to higher level activities    Consulted and Agree with Plan of Care Patient      Patient will benefit from skilled therapeutic intervention in order to improve the following deficits and impairments:  Postural dysfunction, Improper body mechanics, Pain, Decreased range of motion, Decreased mobility, Decreased activity tolerance  Visit Diagnosis: Acute midline low back pain without sciatica  Other symptoms and signs involving the musculoskeletal system     Problem List Patient Active Problem List   Diagnosis Date Noted  . Lumbar degenerative disc disease 02/27/2014  . Family history of prostate cancer 09/13/2013  . Hemorrhoids 10/22/2011  . OTHER TESTICULAR HYPOFUNCTION 01/05/2008    Rockie Schnoor Rober Minion PT, MPH  08/10/2016, 2:40 PM  [facility] [address] [place], [place], [address] Phone: [phone]   Fax:  [phone]  Name: John Steele MRN: [id number] Date of Birth: 12-30-1980

## 2016-08-14 ENCOUNTER — Encounter: Payer: BLUE CROSS/BLUE SHIELD | Admitting: Physical Therapy

## 2016-08-17 ENCOUNTER — Encounter: Payer: BLUE CROSS/BLUE SHIELD | Admitting: Rehabilitative and Restorative Service Providers"

## 2016-08-21 ENCOUNTER — Ambulatory Visit (INDEPENDENT_AMBULATORY_CARE_PROVIDER_SITE_OTHER): Payer: BLUE CROSS/BLUE SHIELD | Admitting: Physical Therapy

## 2016-08-21 ENCOUNTER — Encounter: Payer: BLUE CROSS/BLUE SHIELD | Admitting: Rehabilitative and Restorative Service Providers"

## 2016-08-21 DIAGNOSIS — R29898 Other symptoms and signs involving the musculoskeletal system: Secondary | ICD-10-CM

## 2016-08-21 DIAGNOSIS — M545 Low back pain, unspecified: Secondary | ICD-10-CM

## 2016-08-21 NOTE — Therapy (Signed)
[facility] [place] [address] [place], [place], [address] Phone: [phone]   Fax:  [phone]  Physical Therapy Treatment  Patient Details  Name: John Steele MRN: [id number] Date of Birth: 1980/07/23 Referring Provider: Dr. Briant Sites  Encounter Date: 08/21/2016      PT End of Session - 08/21/16 1539    Visit Number 3   Number of Visits 12   Date for PT Re-Evaluation 09/14/16   PT Start Time 1535   PT Stop Time 1608   PT Time Calculation (min) 33 min   Activity Tolerance Patient tolerated treatment well   Behavior During Therapy [facility] for tasks assessed/performed      No past medical history on file.  Past Surgical History:  Procedure Laterality Date  . NO PAST SURGERIES      There were no vitals filed for this visit.      Subjective Assessment - 08/21/16 1540    Subjective Pt reports it has been a good week.  He has only had to take Meloxicam 1x this week due to long drive. He was able to work out at gym this morning with out any production of symptoms.     Patient Stated Goals get rid of pain and learn how to care for back    Currently in Pain? No/denies   Pain Score 0-No pain            [facility] PT Assessment - 08/21/16 0001      Assessment   Medical Diagnosis Lumbar DDD    Referring Provider Dr. Briant Sites   Onset Date/Surgical Date 06/29/16   Hand Dominance Right   Next MD Visit PRN    Bilat hamstring flexibility 70 deg.        [facility] Adult PT Treatment/Exercise - 08/21/16 0001      Lumbar Exercises: Stretches   Passive Hamstring Stretch 3 reps;30 seconds   Passive Hamstring Stretch Limitations pt shown seated version   Press Ups --  2-3 sec hold x 10    Piriformis Stretch 2 reps;30 seconds  2 reps in supine, 2 reps in sitting.      Lumbar Exercises: Aerobic   Stationary Bike NuStep L5: 6 min      Lumbar Exercises: Supine   Bridge 10 reps  10 sec hold core engaged      Lumbar  Exercises: Prone   Other Prone Lumbar Exercises prone press 5 sec x 10; prone alt knee flex/ext  x 10; pelvic press with bilat knee flex /ext.      Lumbar Exercises: Quadruped   Madcat/Old Horse 5 reps   Opposite Arm/Leg Raise Right arm/Left leg;Left arm/Right leg;5 reps;3 seconds     Moist Heat Therapy   Number Minutes Moist Heat --  pt declined.                 PT Education - 08/21/16 1608    Education provided Yes   Education Details HEP    Person(s) Educated Patient   Methods Explanation;Handout;Demonstration;Tactile cues   Comprehension Verbalized understanding;Returned demonstration             PT Long Term Goals - 08/21/16 1543      PT LONG TERM GOAL #1   Title Improve trunk mobility through spinal extension to 75% limitation 09/14/16   Time 6   Period Weeks   Status On-going     PT LONG TERM GOAL #2   Title Increase LE flexibility and tissue extensibility 09/14/16  Time 6   Period Weeks   Status On-going     PT LONG TERM GOAL #3   Title Patient reports increased sitting and standing tolerance 09/14/16   Time 6   Period Weeks   Status On-going     PT LONG TERM GOAL #4   Title Independent in HEP 09/14/16   Time 6   Period Weeks   Status On-going     PT LONG TERM GOAL #5   Title Improve FOTO to </= 19% limitation 09/14/16    Time 6   Period Weeks   Status On-going               Plan - 08/21/16 1611    Clinical Impression Statement Pt tolerated all exercises well, without any production of symptoms.  Hamstrings remain tight (~70 deg).  Pt's sitting/standing tolerance have improved per pt report.  Pt declined modalities; will heat at home if needed.  Pt progressing well towards goals.    Rehab Potential Good   PT Frequency 2x / week   PT Duration 6 weeks   PT Treatment/Interventions Patient/family education;ADLs/Self Care Home Management;Cryotherapy;Electrical Stimulation;Iontophoresis 4mg /ml Dexamethasone;Moist  Heat;Traction;Ultrasound;Dry needling;Manual techniques;Therapeutic activities;Therapeutic exercise   PT Next Visit Plan progress with stretching; continue with core stabilization; manual work/spinal mobs and modalities as indicated; progress to higher level activities    Consulted and Agree with Plan of Care Patient      Patient will benefit from skilled therapeutic intervention in order to improve the following deficits and impairments:  Postural dysfunction, Improper body mechanics, Pain, Decreased range of motion, Decreased mobility, Decreased activity tolerance  Visit Diagnosis: Acute midline low back pain without sciatica  Other symptoms and signs involving the musculoskeletal system     Problem List Patient Active Problem List   Diagnosis Date Noted  . Lumbar degenerative disc disease 02/27/2014  . Family history of prostate cancer 09/13/2013  . Hemorrhoids 10/22/2011  . OTHER TESTICULAR HYPOFUNCTION 01/05/2008   Mayer CamelJennifer Carlson-Long, PTA 08/21/16 4:15 PM  [facility] [address] [place], [place], [address] Phone: [phone]   Fax:  [phone]  Name: John Steele MRN: [id number] Date of Birth: 26-May-1981

## 2016-08-21 NOTE — Patient Instructions (Signed)
Healthy Back Strengthening - Back Extension on All Fours    Start on hands and knees, keeping them apart. Straighten right leg and left arm at the same time. Hold __4__ seconds. Switch immediately and repeat with left leg and right arm. Do __10__ times. IIncrease each hold gradually up to __10__ seconds.  Pelvic Press     Place hands under belly between navel and pubic bone, palms up. Feel pressure on hands. Increase pressure on hands by pressing pelvis down. This is NOT a pelvic tilt. Hold __5_ seconds. Relax. Repeat _10__ times. Once a day.  KNEE: Flexion - Prone   Hold pelvic press. Bend knee, then return the foot down. Repeat on opposite leg. Do not raise hips. _10__ reps per set. When this is mastered, pull both heels up at same time, x 10 reps.  Once a day  Hip Stretch    Put right ankle over left knee. Let right knee fall downward, but keep ankle in place. Feel the stretch in hip. May pull knee towards opposite shoulder.  Hold _15-30___ seconds while counting out loud. Repeat with other leg. Repeat _2___ times. Do ___2_ sessions per day.  http://gt2.exer.us/497   Copyright  VHI. All rights reserved.   [facility] [address] [place], [place] [address]  [phone] (office) [phone] (fax)

## 2016-09-04 ENCOUNTER — Ambulatory Visit (INDEPENDENT_AMBULATORY_CARE_PROVIDER_SITE_OTHER): Payer: BLUE CROSS/BLUE SHIELD | Admitting: Physical Therapy

## 2016-09-04 DIAGNOSIS — R29898 Other symptoms and signs involving the musculoskeletal system: Secondary | ICD-10-CM

## 2016-09-04 DIAGNOSIS — M545 Low back pain, unspecified: Secondary | ICD-10-CM

## 2016-09-04 NOTE — Therapy (Addendum)
[place] [place], [place], [address] Phone: [phone]   Fax:  [phone]  Physical Therapy Treatment  Patient Details  Name: John Steele MRN: [id number] Date of Birth: 06/19/1981 Referring Provider: Dr. Helane Rima  Encounter Date: 09/04/2016      PT End of Session - 09/04/16 1009    Visit Number 4   Number of Visits 12   Date for PT Re-Evaluation 09/14/16   PT Start Time 0932   PT Stop Time 1026   PT Time Calculation (min) 54 min   Activity Tolerance Patient tolerated treatment well;No increased pain   Behavior During Therapy [facility] for tasks assessed/performed      No past medical history on file.  Past Surgical History:  Procedure Laterality Date  . NO PAST SURGERIES      There were no vitals filed for this visit.      Subjective Assessment - 09/04/16 0934    Subjective Pt reports he has noticed more back pain over the last week. He did core work out last week and had increased back pain afterward (5/10). He is taking Meloxicam or tylenol as needed.  He is more aware of his back pain.  He has bought lumbar support for car and office.    Currently in Pain? Yes   Pain Score 3    Pain Location Back   Pain Orientation Lower   Pain Descriptors / Indicators Dull;Aching            [facility] PT Assessment - 09/04/16 0001      Assessment   Medical Diagnosis Lumbar DDD      Flexibility   Hamstrings Lt ~65 deg, Rt 70 deg.           [place] Adult PT Treatment/Exercise - 09/04/16 0001      Exercises   Exercises Lumbar;Knee/Hip     Lumbar Exercises: Stretches   Passive Hamstring Stretch 3 reps;30 seconds   Passive Hamstring Stretch Limitations pt shown seated version, emphasis on straight back.    Hip Flexor Stretch 4 reps   Hip Flexor Stretch Limitations 2 in standing, 2 in high kneeling, 1 trial of Barre position   Standing Extension 1 rep   Prone on Elbows Stretch --  2 min     Press Ups --  2-3 sec hold x 10    Press Ups Limitations PTA provided overpressure to sacrum last 5 reps   Piriformis Stretch 1 rep;20 seconds  each side, tactile cues for neutral spine and improved tech.     Lumbar Exercises: Aerobic   Stationary Bike NuStep L5: 6 min      Lumbar Exercises: Supine   AB Set Limitations Ab set with neutral spine; repeated with pilates table top heel taps, pt to limit range of hip motion to prevent increase back pt x 10 reps      Lumbar Exercises: Prone   Other Prone Lumbar Exercises Prone pelvic press x 5 sec hold x 5 reps; repeated with bilat knee flex/ext x 10 reps      Lumbar Exercises: Quadruped   Madcat/Old Horse 5 reps     Knee/Hip Exercises: Seated   Other Seated Knee/Hip Exercises Sitting on blue disc:  pelvic tilts ant/post to find neutral.      Modalities   Modalities Moist Heat;Electrical Stimulation     Moist Heat Therapy   Number Minutes Moist Heat 15 Minutes   Moist Heat Location Lumbar Spine  Human resources officer Action [employer]   Electrical Stimulation Parameters  to tolerance   Electrical Stimulation Goals Pain                       PT Long Term Goals - 09/04/16 0948      PT LONG TERM GOAL #1   Title Improve trunk mobility through spinal extension to 75% limitation 09/14/16   Time 6   Period Weeks   Status On-going     PT LONG TERM GOAL #2   Title Increase LE flexibility and tissue extensibility 09/14/16   Time 6   Period Weeks     PT LONG TERM GOAL #3   Title Patient reports increased sitting and standing tolerance 09/14/16   Time 6   Period Weeks   Status Achieved     PT LONG TERM GOAL #4   Title Independent in HEP 09/14/16   Time 6   Period Weeks   Status On-going     PT LONG TERM GOAL #5   Title Improve FOTO to </= 19% limitation 09/14/16    Time 6   Period Weeks   Status On-going                Plan - 09/04/16 1011    Clinical Impression Statement Pt's hamstrings remain tight (~65-70 deg).  Pt reports his standing/sitting tolerance have improved; has met LTG #3.  Pt educated on neutral pelvis and avoiding postures that flex the lower spine with sitting and during exercise classes. Pt making gradual gains towards established goals.    Rehab Potential Good   PT Frequency 2x / week   PT Duration 6 weeks   PT Treatment/Interventions Patient/family education;ADLs/Self Care Home Management;Cryotherapy;Electrical Stimulation;Iontophoresis 68m/ml Dexamethasone;Moist Heat;Traction;Ultrasound;Dry needling;Manual techniques;Therapeutic activities;Therapeutic exercise   PT Next Visit Plan progress with stretching; continue with core stabilization; manual work/spinal mobs and modalities as indicated; progress to higher level activities    Consulted and Agree with Plan of Care Patient      Patient will benefit from skilled therapeutic intervention in order to improve the following deficits and impairments:  Postural dysfunction, Improper body mechanics, Pain, Decreased range of motion, Decreased mobility, Decreased activity tolerance  Visit Diagnosis: Acute midline low back pain without sciatica  Other symptoms and signs involving the musculoskeletal system     Problem List Patient Active Problem List   Diagnosis Date Noted  . Lumbar degenerative disc disease 02/27/2014  . Family history of prostate cancer 09/13/2013  . Hemorrhoids 10/22/2011  . OTHER TESTICULAR HYPOFUNCTION 01/05/2008   JKerin Perna PTA 09/04/16 1:21 PM [place] [address] [address]: [phone]  Fax:  [phone] Name: John FRITHMRN: [id number] of Birth: [birth year] PHYSICAL THERAPY DISCHARGE SUMMARY  Visits from Start of Care: 4  Current functional level related to goals / functional outcomes: See  progress note for current status   Remaining deficits: See note   Education / Equipment: HEP Plan: Patient agrees to discharge.  Patient goals were partially met. Patient is being discharged due to not returning since the last visit.  ?????     Celyn P. HHelene KelpPT, MPH 10/07/16 2:23 PM

## 2016-09-11 ENCOUNTER — Encounter: Payer: BLUE CROSS/BLUE SHIELD | Admitting: Rehabilitative and Restorative Service Providers"

## 2016-10-01 ENCOUNTER — Emergency Department (INDEPENDENT_AMBULATORY_CARE_PROVIDER_SITE_OTHER)
Admission: EM | Admit: 2016-10-01 | Discharge: 2016-10-01 | Disposition: A | Discharge status: Home / Self Care | Payer: BLUE CROSS/BLUE SHIELD | Source: Home / Self Care | Attending: Family Medicine | Admitting: Family Medicine

## 2016-10-01 ENCOUNTER — Encounter: Payer: Self-pay | Admitting: *Deleted

## 2016-10-01 DIAGNOSIS — J029 Acute pharyngitis, unspecified: Secondary | ICD-10-CM

## 2016-10-01 DIAGNOSIS — B9789 Other viral agents as the cause of diseases classified elsewhere: Secondary | ICD-10-CM

## 2016-10-01 DIAGNOSIS — J069 Acute upper respiratory infection, unspecified: Secondary | ICD-10-CM

## 2016-10-01 LAB — POCT RAPID STREP A (OFFICE): Rapid Strep A Screen: NEGATIVE

## 2016-10-01 MED ORDER — IBUPROFEN 600 MG PO TABS
600.0000 mg | ORAL_TABLET | Freq: Once | ORAL | Status: AC
  Administered 2016-10-01: 600 mg via ORAL

## 2016-10-01 MED ORDER — AZITHROMYCIN 250 MG PO TABS: ORAL_TABLET | ORAL | 0 refills | Status: DC

## 2016-10-01 NOTE — ED Provider Notes (Signed)
[facility]    CSN: [id number] Arrival date & time: 10/01/16  0819     History   Chief Complaint Chief Complaint  Patient presents with  . Cough  . Sore Throat    HPI [name] is a 36 y.o. male.   Two days ago patient developed a mild sore throat that became worse yesterday.  Yesterday he became more fatigued and developed a mild cough.  Today he has had chills and myalgias.  He denies nasal congestion.   The history is provided by the patient.    History reviewed. No pertinent past medical history.  Patient Active Problem List   Diagnosis Date Noted  . Lumbar degenerative disc disease 02/27/2014  . Family history of prostate cancer 09/13/2013  . Hemorrhoids 10/22/2011  . OTHER TESTICULAR HYPOFUNCTION 01/05/2008    Past Surgical History:  Procedure Laterality Date  . NO PAST SURGERIES         Home Medications    Prior to Admission medications   Medication Sig Start Date End Date Taking? Authorizing Provider  azithromycin (ZITHROMAX Z-PAK) 250 MG tablet Take 2 tabs today; then begin one tab once daily for 4 more days. (Rx void after 10/09/16) 10/01/16   John Haw, MD  meloxicam (MOBIC) 15 MG tablet One tab PO qAM with breakfast for 2 weeks, then daily prn pain. 07/22/16   John Becton, MD    Family History Family History  Problem Relation Age of Onset  . Hypertension Mother   . Diabetes Father   . Prostate cancer Father 61    Social History Social History  Substance Use Topics  . Smoking status: Former Smoker    Types: Cigars  . Smokeless tobacco: Never Used  . Alcohol use 3.6 oz/week    6 Cans of beer per week     Allergies   Patient has no known allergies.   Review of Systems Review of Systems + sore throat + cough No pleuritic pain No wheezing ? nasal congestion + post-nasal drainage No sinus pain/pressure No itchy/red eyes No earache No hemoptysis No SOB + fever, + chills No nausea No  vomiting No abdominal pain No diarrhea No urinary symptoms No skin rash + fatigue + myalgias No headache Used OTC meds without relief   Physical Exam Triage Vital Signs ED Triage Vitals [10/01/16 0852]  Enc Vitals Group     BP      Pulse      Resp      Temp      Temp src      SpO2      Weight 201 lb (91.2 kg)     Height      Head Circumference      Peak Flow      Pain Score 6     Pain Loc      Pain Edu?      Excl. in GC?    No data found.   Updated Vital Signs Wt 201 lb (91.2 kg)   BMI 28.84 kg/m   Visual Acuity Right Eye Distance:   Left Eye Distance:   Bilateral Distance:    Right Eye Near:   Left Eye Near:    Bilateral Near:     Physical Exam Nursing notes and Vital Signs reviewed. Appearance:  Patient appears stated age, and in no acute distress Eyes:  Pupils are equal, round, and reactive to light and accomodation.  Extraocular movement is intact.  Conjunctivae are  not inflamed  Ears:  Canals normal.  Tympanic membranes normal.  Nose:  Mildly congested turbinates.  No sinus tenderness.    Pharynx:  Uvula erythematous Neck:  Supple.  Mildly tender enlarged posterior/lateral nodes are palpated bilaterally.  Tender tonsillar nodes bilaterally. Lungs:  Clear to auscultation.  Breath sounds are equal.  Moving air well. Heart:  Regular rate and rhythm without murmurs, rubs, or gallops.  Abdomen:  Nontender without masses or hepatosplenomegaly.  Bowel sounds are present.  No CVA or flank tenderness.  Extremities:  No edema.  Skin:  No rash present.    UC Treatments / Results  Labs (all labs ordered are listed, but only abnormal results are displayed) Labs Reviewed  STREP A DNA PROBE  POCT RAPID STREP A (OFFICE) Negative.    EKG  EKG Interpretation None       Radiology No results found.  Procedures Procedures (including critical care time)  Medications Ordered in UC Medications  ibuprofen (ADVIL,MOTRIN) tablet 600 mg (600 mg Oral Given  10/01/16 0854)     Initial Impression / Assessment and Plan / UC Course  I have reviewed the triage vital signs and the nursing notes.  Pertinent labs & imaging results that were available during my care of the patient were reviewed by me and considered in my medical decision making (see chart for details).    There is no evidence of bacterial infection today.   With tenderness of tonsillar nodes will send throat culture. Treat symptomatically for now: Take plain guaifenesin (  extended release tabs such as Mucinex) twice daily, with plenty of water, for cough and congestion.  May add Pseudoephedrine ( , one or two every 4 to 6 hours) for sinus congestion.  Get adequate rest.   May use Afrin nasal spray (or generic oxymetazoline) each morning for about 5 days and then discontinue.  Also recommend using saline nasal spray several times daily and saline nasal irrigation (AYR is a common brand).  Use Flonase nasal spray each morning after using Afrin nasal spray and saline nasal irrigation. Try warm salt water gargles for sore throat.  Stop all antihistamines for now, and other non-prescription cough/cold preparations. May take Ibuprofen , 4 tabs every 8 hours with food for sore throat, fever, etc. May take Delsym Cough Suppressant at bedtime for nighttime cough.  Begin Azithromycin if not improving about one week or if persistent fever develops (Given a prescription to hold, with an expiration date)  Follow-up with family doctor if not improving about10 days.     Final Clinical Impressions(s) / UC Diagnoses   Final diagnoses:  Acute pharyngitis, unspecified etiology  Viral URI with cough    New Prescriptions New Prescriptions   AZITHROMYCIN (ZITHROMAX Z-PAK) 250 MG TABLET    Take 2 tabs today; then begin one tab once daily for 4 more days. (Rx void after 10/09/16)     John Haw, MD 10/01/16 [phone]

## 2016-10-01 NOTE — Discharge Instructions (Signed)
Take plain guaifenesin (  extended release tabs such as Mucinex) twice daily, with plenty of water, for cough and congestion.  May add Pseudoephedrine ( , one or two every 4 to 6 hours) for sinus congestion.  Get adequate rest.   May use Afrin nasal spray (or generic oxymetazoline) each morning for about 5 days and then discontinue.  Also recommend using saline nasal spray several times daily and saline nasal irrigation (AYR is a common brand).  Use Flonase nasal spray each morning after using Afrin nasal spray and saline nasal irrigation. Try warm salt water gargles for sore throat.  Stop all antihistamines for now, and other non-prescription cough/cold preparations. May take Ibuprofen , 4 tabs every 8 hours with food for sore throat, fever, etc. May take Delsym Cough Suppressant at bedtime for nighttime cough.  Begin Azithromycin if not improving about one week or if persistent fever develops   Follow-up with family doctor if not improving about10 days.

## 2016-10-01 NOTE — ED Triage Notes (Addendum)
Patient c/o 2 days of scratchy throat and cough. About 2 days ago a gnat flew into his throat while talking. Cough is now productive and c/o chest soreness.

## 2016-10-02 ENCOUNTER — Telehealth: Payer: Self-pay | Admitting: *Deleted

## 2016-10-02 LAB — STREP A DNA PROBE: GASP: NOT DETECTED

## 2016-10-02 NOTE — Telephone Encounter (Signed)
Callback: No answer LMOM f/u from visit. TCX negative. Fill antibiotic if no improvement after 0 days or persistent fever.

## 2017-07-02 DIAGNOSIS — F4320 Adjustment disorder, unspecified: Secondary | ICD-10-CM | POA: Diagnosis not present

## 2017-10-08 IMAGING — DX DG LUMBAR SPINE COMPLETE 4+V
5 series · 5 of 5 positions shown · non-contrast
Comparison: Lumbar spine series of February 27, 2014

CLINICAL DATA: Several year history of low back pain with no known
injury.

EXAM:
LUMBAR SPINE - COMPLETE 4+ VIEW

[l-spine ap]
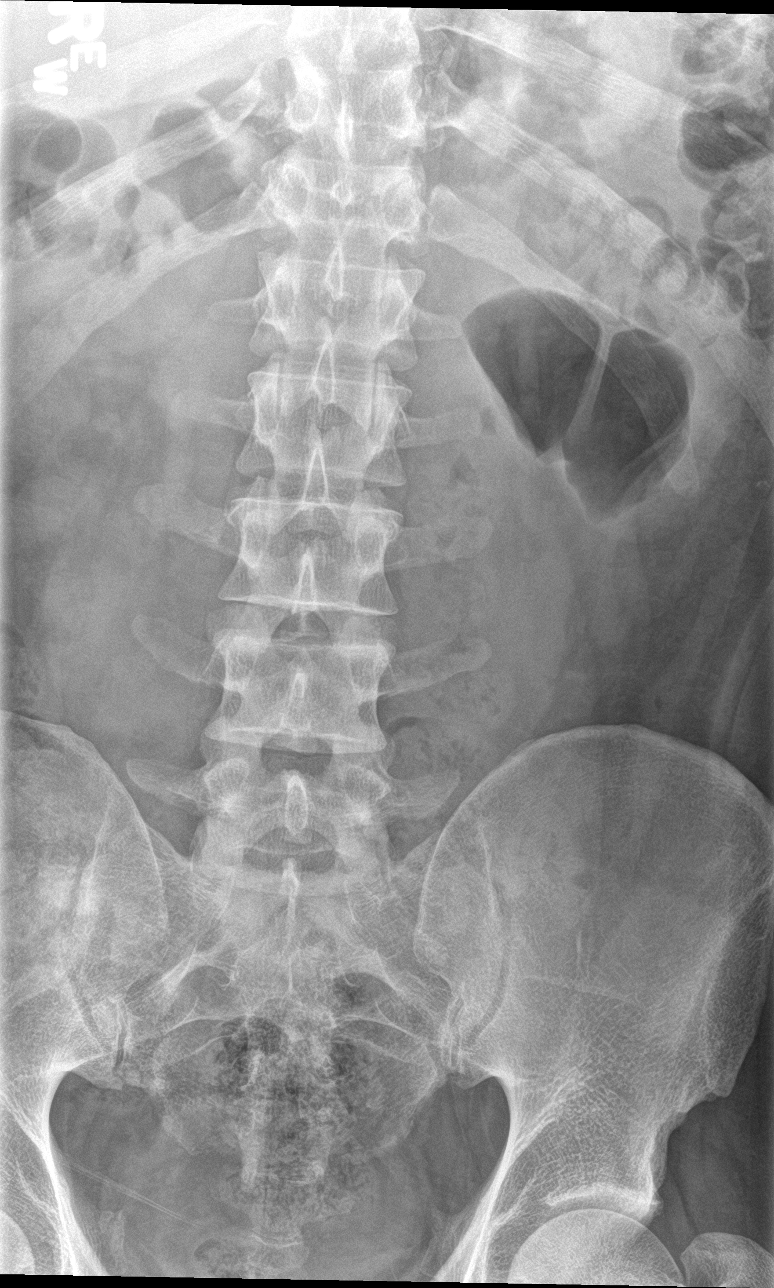

[l-spine obl (1 of 2)]
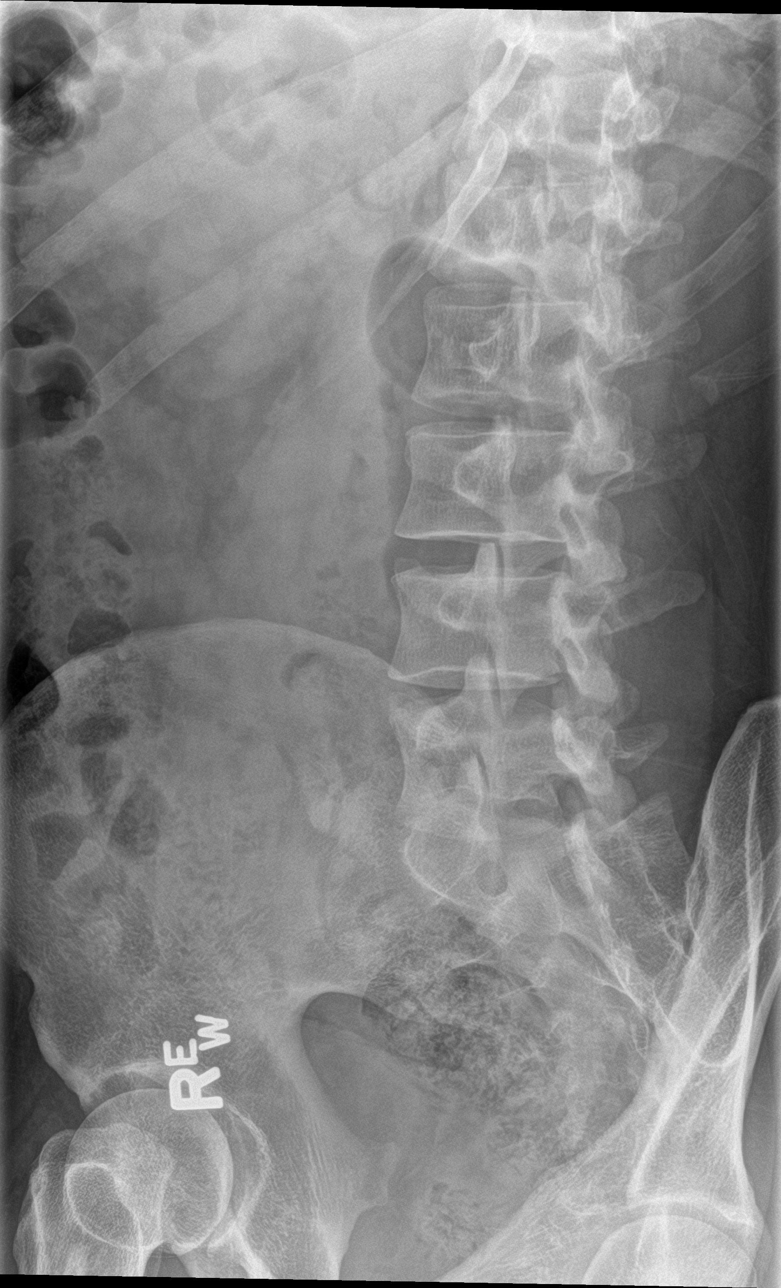

[l-spine obl (2 of 2)]
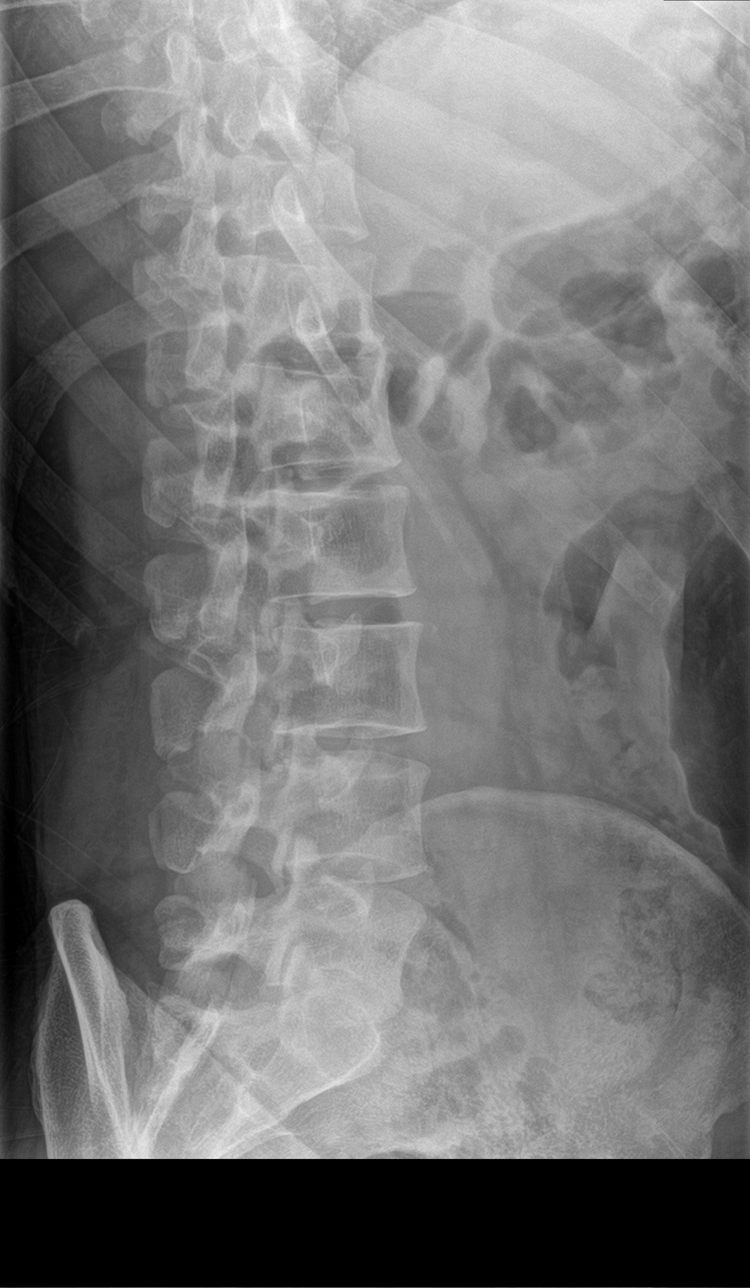

[l-spine lat]
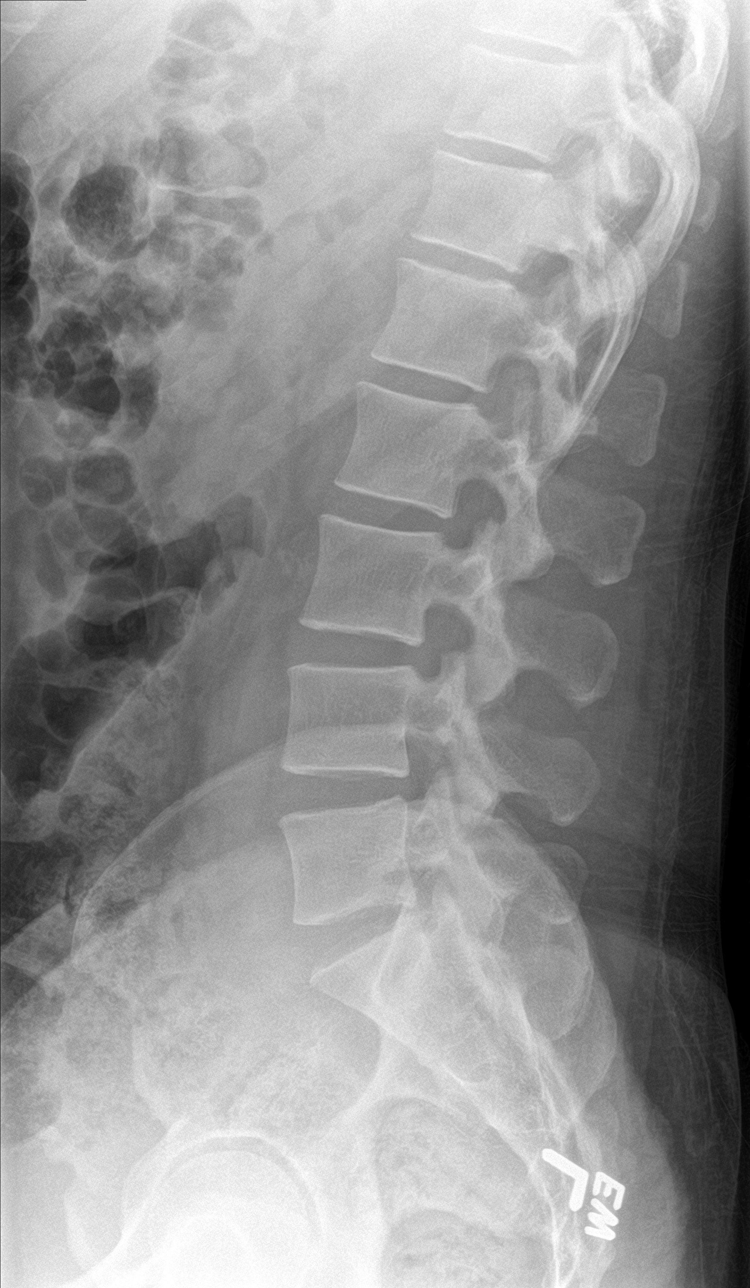

[l-spine spot]
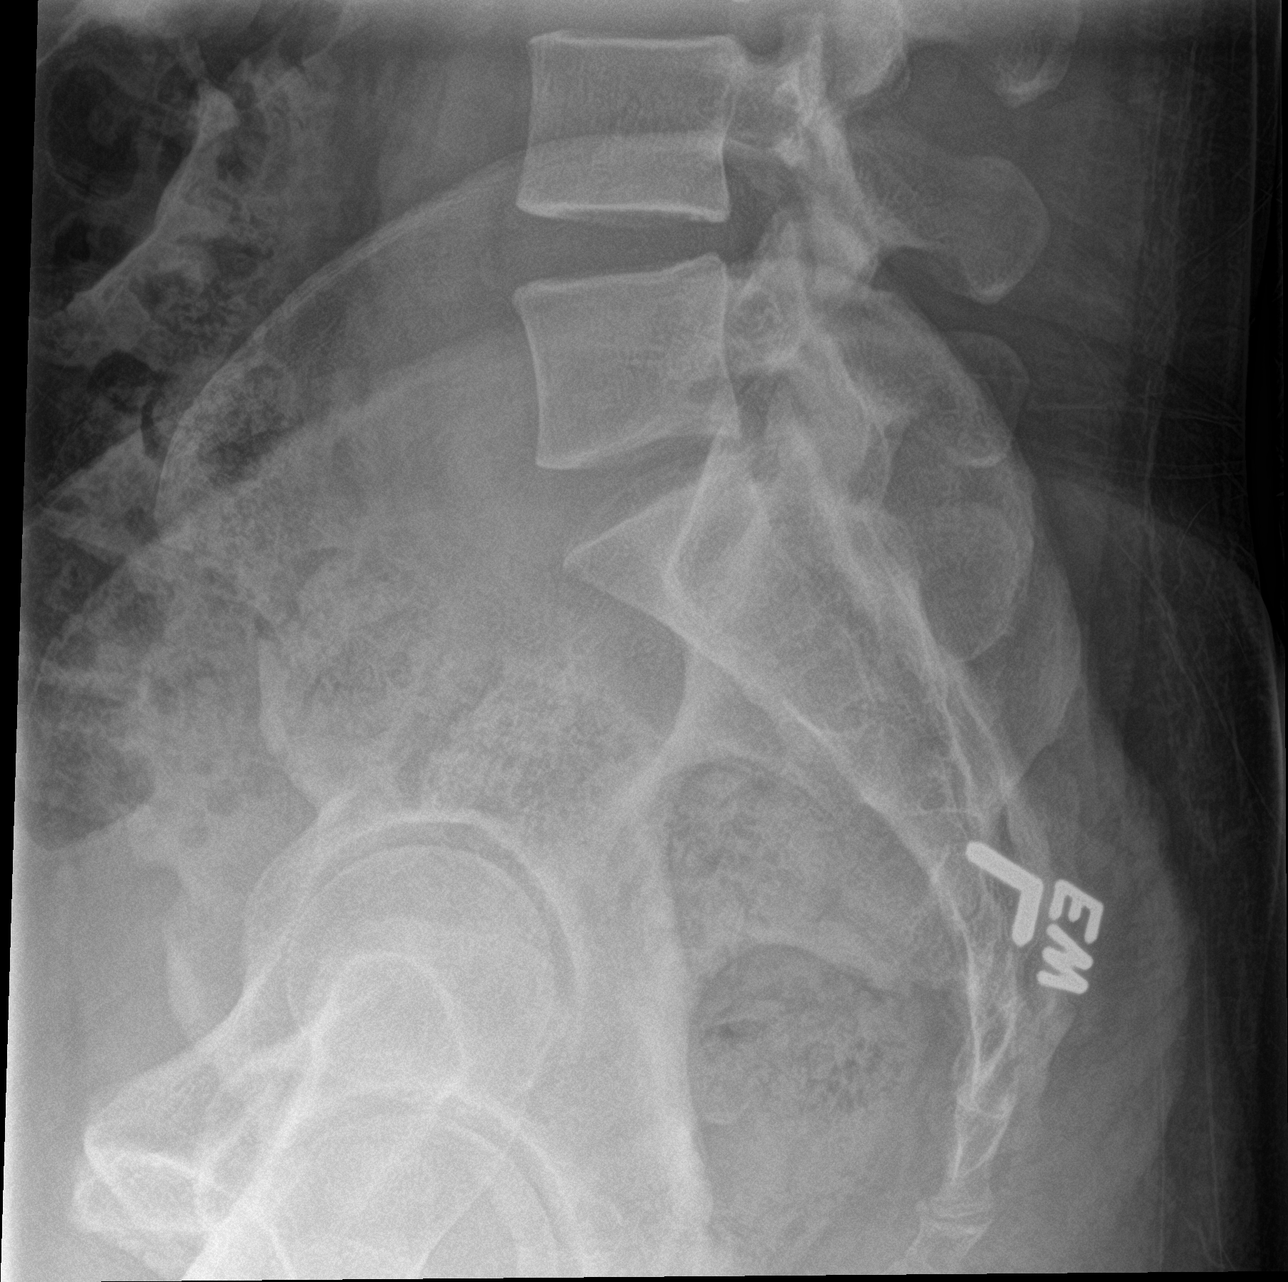

[5 of 5 positions shown; findings below may reference images not displayed]

FINDINGS: The lumbar vertebral bodies are preserved in height. The disc space
heights are well maintained. There is no pars defect or
spondylolisthesis. The pedicles and transverse processes are intact.
The observed portions of the sacrum are normal. There is a focal
area of scleroses on either side of the upper right SI joint. No
bony ankylosis here is observed.
IMPRESSION: There is no acute or chronic bony abnormality of the lumbar spine.
There is scleroses associated with the upper aspect of the right SI
joint. A dedicated SI joint series is recommended.

## 2017-11-26 ENCOUNTER — Ambulatory Visit (INDEPENDENT_AMBULATORY_CARE_PROVIDER_SITE_OTHER): Payer: BLUE CROSS/BLUE SHIELD | Admitting: Family Medicine

## 2017-11-26 ENCOUNTER — Encounter: Payer: Self-pay | Admitting: Family Medicine

## 2017-11-26 VITALS — BP 128/69 | HR 79 | Ht 70.0 in | Wt 207.0 lb

## 2017-11-26 DIAGNOSIS — R0683 Snoring: Secondary | ICD-10-CM

## 2017-11-26 DIAGNOSIS — R400 Somnolence: Secondary | ICD-10-CM

## 2017-11-26 NOTE — Progress Notes (Signed)
Subjective:    Patient ID: John Steele, male    DOB: [birth year], 37 y.o.   MRN: [id number]  HPI 37 yo male is here today.  He says his wife encouraged him to come in because he has been snoring for years.  He said at one point it actually got a little better but is still quite loud and that can be heard in the next room.  He is also noticed that sometimes even during our conversation in the middle of the day he will feel overwhelmingly tired most like he wants to fall asleep.  He thinks his dad actually has sleep apnea as well.  He does not have a diagnosis of high blood pressure and his BMI is not greater than 35.  That he has had some witnessed apneas.  Does occasionally wake up with headaches in the morning. No worsening or    Review of Systems  BP 128/69   Pulse 79   Ht  (1.778 m)   Wt 207 lb (93.9 kg)   SpO2 98%   BMI 29.70 kg/m     No Known Allergies  No past medical history on file.  Past Surgical History:  Procedure Laterality Date  . NO PAST SURGERIES      Social History   Socioeconomic History  . Marital status: Married    Spouse name: Not on file  . Number of children: 1  . Years of education: Not on file  . Highest education level: Not on file  Occupational History  . Occupation: IT recruiting     CommentTheatre stage manager.   Social Needs  . Financial resource strain: Not on file  . Food insecurity:    Worry: Not on file    Inability: Not on file  . Transportation needs:    Medical: Not on file    Non-medical: Not on file  Tobacco Use  . Smoking status: Former Smoker    Types: Cigars  . Smokeless tobacco: Never Used  Substance and Sexual Activity  . Alcohol use: Yes    Alcohol/week: 3.6 oz    Types: 6 Cans of beer per week  . Drug use: Not on file  . Sexual activity: Yes    Partners: Female  Lifestyle  . Physical activity:    Days per week: Not on file    Minutes per session: Not on file  . Stress: Not on file  Relationships  . Social  connections:    Talks on phone: Not on file    Gets together: Not on file    Attends religious service: Not on file    Active member of club or organization: Not on file    Attends meetings of clubs or organizations: Not on file    Relationship status: Not on file  . Intimate partner violence:    Fear of current or ex partner: Not on file    Emotionally abused: Not on file    Physically abused: Not on file    Forced sexual activity: Not on file  Other Topics Concern  . Not on file  Social History Narrative   Starting to  regular exercise.     Family History  Problem Relation Age of Onset  . Hypertension Mother   . Diabetes Father   . Prostate cancer Father 61    Outpatient Encounter Medications as of 11/26/2017  Medication Sig  . [DISCONTINUED] azithromycin (ZITHROMAX Z-PAK) 250 MG tablet Take 2 tabs today; then begin  one tab once daily for 4 more days. (Rx void after 10/09/16)  . [DISCONTINUED] meloxicam (MOBIC) 15 MG tablet One tab PO qAM with breakfast for 2 weeks, then daily prn pain.   No facility-administered encounter medications on file as of 11/26/2017.          Objective:   Physical Exam  Constitutional: He is oriented to person, place, and time. He appears well-developed and well-nourished.  HENT:  Head: Normocephalic and atraumatic.  Right Ear: External ear normal.  Left Ear: External ear normal.  Nose: Nose normal.  Mouth/Throat: Oropharynx is clear and moist.  TMs and canals are clear.   Eyes: Pupils are equal, round, and reactive to light. Conjunctivae and EOM are normal.  Neck: Neck supple. No thyromegaly present.  Cardiovascular: Normal rate and normal heart sounds.  Pulmonary/Chest: Effort normal and breath sounds normal.  Lymphadenopathy:    He has no cervical adenopathy.  Neurological: He is alert and oriented to person, place, and time.  Skin: Skin is warm and dry.  Psychiatric: He has a normal mood and affect.       Assessment & Plan:   Snoring -stop bang questionnaire score of 5 which is significant for possible sleep apnea and based on his descriptions of symptoms I am highly suspicious of the diagnosis.  We discussed options of home sleep study versus in lab sleep slight it study.  Once we get the study set up then I would like to see him back to go over the results unless they are negative.  And then we can get him started on treatment options.

## 2018-01-10 ENCOUNTER — Ambulatory Visit (HOSPITAL_BASED_OUTPATIENT_CLINIC_OR_DEPARTMENT_OTHER): Payer: BLUE CROSS/BLUE SHIELD | Attending: Family Medicine | Admitting: Internal Medicine

## 2018-01-10 VITALS — Ht 71.0 in | Wt 210.0 lb

## 2018-01-10 DIAGNOSIS — R0683 Snoring: Secondary | ICD-10-CM | POA: Diagnosis not present

## 2018-01-10 DIAGNOSIS — R400 Somnolence: Secondary | ICD-10-CM | POA: Diagnosis not present

## 2018-01-10 DIAGNOSIS — G4733 Obstructive sleep apnea (adult) (pediatric): Secondary | ICD-10-CM | POA: Insufficient documentation

## 2018-01-15 DIAGNOSIS — G4733 Obstructive sleep apnea (adult) (pediatric): Secondary | ICD-10-CM

## 2018-01-15 NOTE — Procedures (Signed)
   Patient Name: John Steele, John Steele Date: 01/10/2018 Gender: Male D.O.B: June 25, 1981 Age (years): 36 Referring Provider: Nani Gasseratherine Metheney Height (inches): 71 Interpreting Physician: Jetty Duhamellinton Young MD, ABSM Weight (lbs): 210 RPSGT: [place], [place] BMI: 29 MRN: [id number] Neck Size: 17.50  CLINICAL INFORMATION Sleep Steele Type: HST  Indication for sleep Steele: OSA  Epworth Sleepiness Score: 15  SLEEP Steele TECHNIQUE A multi-channel overnight portable sleep Steele was performed. The channels recorded were: nasal airflow, thoracic respiratory movement, and oxygen saturation with a pulse oximetry. Snoring was also monitored.  MEDICATIONS Patient self administered medications include: none reported.  SLEEP ARCHITECTURE Patient was studied for 457.9 minutes. The sleep efficiency was 99.8 % and the patient was supine for 99.8%. The arousal index was 0.0 per hour.  RESPIRATORY PARAMETERS The overall AHI was 16.4 per hour, with a central apnea index of 0.0 per hour.  The oxygen nadir was 81% during sleep.  CARDIAC DATA Mean heart rate during sleep was 64.5 bpm.  IMPRESSIONS - Moderate obstructive sleep apnea occurred during this Steele (AHI = 16.4/h). - No significant central sleep apnea occurred during this Steele (CAI = 0.0/h). - Moderate oxygen desaturation was noted during this Steele (Min O2 = 81%). - Patient snored.  DIAGNOSIS - Obstructive Sleep Apnea (327.23 [G47.33 ICD-10])  RECOMMENDATIONS - Suggest CPAP titrationsleepstudy or DME autopap. - Avoid alcohol, sedatives and other CNS depressants that may worsen sleep apnea and disrupt normal sleep architecture. - Sleep hygiene should be reviewed to assess factors that may improve sleep quality. - Weight management and regular exercise should be initiated or continued.  [Electronically signed] 01/15/2018 10:29 AM  Jetty Duhamellinton Young MD, ABSM Diplomate, American Board of Sleep Medicine   NPI: [id number]                           Jetty Duhamellinton Young Diplomate, American Board of Sleep Medicine  ELECTRONICALLY SIGNED ON:  01/15/2018, 10:26 AM [facility] PH: [phone]   FX: [phone] ACCREDITED BY THE AMERICAN ACADEMY OF SLEEP MEDICINE

## 2018-02-15 ENCOUNTER — Ambulatory Visit (INDEPENDENT_AMBULATORY_CARE_PROVIDER_SITE_OTHER): Payer: BLUE CROSS/BLUE SHIELD | Admitting: Family Medicine

## 2018-02-15 ENCOUNTER — Telehealth: Payer: Self-pay | Admitting: Family Medicine

## 2018-02-15 ENCOUNTER — Encounter: Payer: Self-pay | Admitting: Family Medicine

## 2018-02-15 VITALS — BP 127/79 | HR 62 | Ht 71.0 in | Wt 208.0 lb

## 2018-02-15 DIAGNOSIS — G4733 Obstructive sleep apnea (adult) (pediatric): Secondary | ICD-10-CM | POA: Diagnosis not present

## 2018-02-15 MED ORDER — AMBULATORY NON FORMULARY MEDICATION: 0 refills | Status: DC

## 2018-02-15 NOTE — Telephone Encounter (Signed)
If Dr Ihor GullyBiggerstaff doesn't do Sleep Apnea and Snoring Appliances then John Steele and John Steele does them.     [facility], Theustin Steele & John Steele, DDS  [place]  Address: [address], [place], [place] [address]  Phone: [phone]

## 2018-02-15 NOTE — Telephone Encounter (Signed)
I called and asked if Dr Ihor GullyBiggerstaff does mouth pieces for sleep apnea. They are going to call me back with an answer.    Marcelo Baldyeresa G. Biggerstaff, DDS, MD [place] Office: [facility] Phone Number [phone]  [place]: [place] Phone Number [phone]

## 2018-02-15 NOTE — Telephone Encounter (Signed)
They recommend [facility] or a referral to them.

## 2018-02-15 NOTE — Telephone Encounter (Signed)
Please call Dr. Rudean HaskellJavaid's office here in town with pulmonary and see who he uses for oral appliances.  I would like to send this patient directly for consultation for oral appliance for sleep apnea.

## 2018-02-15 NOTE — Telephone Encounter (Signed)
Call Rosey Batheresa Biggerstaff's office and see if they do evaluations for oral appliances for sleep apnea.

## 2018-02-15 NOTE — Progress Notes (Signed)
amb  Subjective:    Patient ID: John Steele, male    DOB: 01/24/1981, 37 y.o.   MRN: [id number]  HPI 37 year old male comes in today to go over his sleep study that he had performed in the middle of July on July 15.  It was a home sleep study indicating a AHI of 16.4, with oxygen desaturation to 81% with significant snoring.  No central apneas.  His father has sleep apnea.    Review of Systems  BP 127/79   Pulse 66   Ht 5\' 11"  (1.803 m)   Wt 208 lb (94.3 kg)   SpO2 100%   BMI 29.01 kg/m     No Known Allergies  No past medical history on file.  Past Surgical History:  Procedure Laterality Date  . NO PAST SURGERIES      Social History   Socioeconomic History  . Marital status: Married    Spouse name: Not on file  . Number of children: 1  . Years of education: Not on file  . Highest education level: Not on file  Occupational History  . 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   Social Needs  . Financial resource strain: Not on file  . Food insecurity:    Worry: Not on file    Inability: Not on file  . Transportation needs:    Medical: Not on file    Non-medical: Not on file  Tobacco Use  . Smoking status: Former Smoker    Types: Cigars  . Smokeless tobacco: Never Used  Substance and Sexual Activity  . Alcohol use: Yes    Alcohol/week: 6.0 standard drinks    Types: 6 Cans of beer per week  . Drug use: Not on file  . Sexual activity: Yes    Partners: Female  Lifestyle  . Physical activity:    Days per week: Not on file    Minutes per session: Not on file  . Stress: Not on file  Relationships  . Social connections:    Talks on phone: Not on file    Gets together: Not on file    Attends religious service: Not on file    Active member of club or organization: Not on file    Attends meetings of clubs or organizations: Not on file    Relationship status: Not on file  . Intimate partner violence:    Fear of current or ex partner: Not on file     Emotionally abused: Not on file    Physically abused: Not on file    Forced sexual activity: Not on file  Other Topics Concern  . Not on file  Social History Narrative   Starting to  regular exercise.     Family History  Problem Relation Age of Onset  . Hypertension Mother   . Diabetes Father   . Prostate cancer Father [phone]    Outpatient Encounter Medications as of 02/15/2018  Medication Sig  . AMBULATORY NON FORMULARY MEDICATION Medication Name: CPAP with humidifier, tubing and supplies for new start. Set to AutoPap 2-20 cm water pressure.   See Home Sleep Study. Please fax download in 1-2 weeks so we can set the pressure.  Fax to [facility].   No facility-administered encounter medications on file as of 02/15/2018.        Objective:   Physical Exam  Constitutional: He is oriented to person, place, and time. He appears well-developed and well-nourished.  HENT:  Head: Normocephalic  and atraumatic.  Eyes: Conjunctivae and EOM are normal.  Cardiovascular: Normal rate.  Pulmonary/Chest: Effort normal.  Neurological: He is alert and oriented to person, place, and time.  Skin: Skin is dry. No pallor.  Psychiatric: He has a normal mood and affect. His behavior is normal.  Vitals reviewed.       Assessment & Plan:  Obstructive sleep apnea-whenever conservative measures including sleep hygiene, avoiding alcohol and sedatives and any CNS depressants.  We also discussed CPAP trial.  We will send over prescription for CPAP to be set on AutoPap initially and then will get a download and set his pressure.  Also getting an oral appliance for when he travels he does this frequently with his job but would be willing to use his CPAP when he has home.  In copy of sleep study as well.  I will follow him back up in 1 month to see how he is doing if he has any problems at all he can give us a call in the interim.  He has any problems with the mask itself he can call the home health company and see  if they can offer him something different.

## 2018-02-16 NOTE — Telephone Encounter (Signed)
Referral placed.

## 2018-03-15 ENCOUNTER — Ambulatory Visit (INDEPENDENT_AMBULATORY_CARE_PROVIDER_SITE_OTHER): Payer: BLUE CROSS/BLUE SHIELD | Admitting: Family Medicine

## 2018-03-15 ENCOUNTER — Encounter: Payer: Self-pay | Admitting: Family Medicine

## 2018-03-15 VITALS — BP 136/78 | HR 70 | Ht 71.0 in | Wt 209.0 lb

## 2018-03-15 DIAGNOSIS — G4733 Obstructive sleep apnea (adult) (pediatric): Secondary | ICD-10-CM | POA: Diagnosis not present

## 2018-03-15 DIAGNOSIS — Z0000 Encounter for general adult medical examination without abnormal findings: Secondary | ICD-10-CM | POA: Diagnosis not present

## 2018-03-15 DIAGNOSIS — Z8042 Family history of malignant neoplasm of prostate: Secondary | ICD-10-CM

## 2018-03-15 LAB — COMPLETE METABOLIC PANEL WITH GFR
AG Ratio: 1.6 (calc) (ref 1.0–2.5)
ALBUMIN MSPROF: 5 g/dL (ref 3.6–5.1)
ALT: 61 U/L — ABNORMAL HIGH (ref 9–46)
AST: 26 U/L (ref 10–40)
Alkaline phosphatase (APISO): 62 U/L (ref 40–115)
BILIRUBIN TOTAL: 0.8 mg/dL (ref 0.2–1.2)
BUN: 14 mg/dL (ref 7–25)
CHLORIDE: 100 mmol/L (ref 98–110)
CO2: 29 mmol/L (ref 20–32)
Calcium: 10.7 mg/dL — ABNORMAL HIGH (ref 8.6–10.3)
Creat: 1.1 mg/dL (ref 0.60–1.35)
GFR, Est African American: 100 mL/min/{1.73_m2} (ref >=60)
GFR, Est Non African American: 87 mL/min/{1.73_m2} (ref >=60)
GLUCOSE: 98 mg/dL (ref 65–99)
Globulin: 3.2 g/dL (calc) (ref 1.9–3.7)
Potassium: 5.1 mmol/L (ref 3.5–5.3)
SODIUM: 140 mmol/L (ref 135–146)
Total Protein: 8.2 g/dL — ABNORMAL HIGH (ref 6.1–8.1)

## 2018-03-15 LAB — LIPID PANEL
CHOLESTEROL: 188 mg/dL (ref <200)
HDL: 49 mg/dL (ref >40)
LDL CHOLESTEROL (CALC): 114 mg/dL — AB
Non-HDL Cholesterol (Calc): 139 mg/dL (calc) — ABNORMAL HIGH (ref <130)
Total CHOL/HDL Ratio: 3.8 (calc) (ref <5.0)
Triglycerides: 132 mg/dL (ref <150)

## 2018-03-15 LAB — PSA: PSA: 1 ng/mL (ref <=4.0)

## 2018-03-15 NOTE — Progress Notes (Signed)
Subjective:    Patient ID: John Steele, male    DOB: 09/16/1980, 37 y.o.   MRN: [id number]  HPI 37 yo male is here today for CPE.      Review of Systems  Conference of review of systems is negative except for HPI.  BP 136/78   Pulse 70   Ht 5\' 11"  (1.803 m)   Wt 209 lb (94.8 kg)   SpO2 100%   BMI 29.15 kg/m     No Known Allergies  No past medical history on file.  Past Surgical History:  Procedure Laterality Date  . NO PAST SURGERIES      Social History   Socioeconomic History  . Marital status: Married    Spouse name: Not on file  . Number of children: 1  . Years of education: Not on file  . Highest education level: Not on file  Occupational History  . 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   Social Needs  . Financial resource strain: Not on file  . Food insecurity:    Worry: Not on file    Inability: Not on file  . Transportation needs:    Medical: Not on file    Non-medical: Not on file  Tobacco Use  . Smoking status: Former Smoker    Types: Cigars  . Smokeless tobacco: Never Used  Substance and Sexual Activity  . Alcohol use: Yes    Alcohol/week: 6.0 standard drinks    Types: 6 Cans of beer per week  . Drug use: Not on file  . Sexual activity: Yes    Partners: Female  Lifestyle  . Physical activity:    Days per week: Not on file    Minutes per session: Not on file  . Stress: Not on file  Relationships  . Social connections:    Talks on phone: Not on file    Gets together: Not on file    Attends religious service: Not on file    Active member of club or organization: Not on file    Attends meetings of clubs or organizations: Not on file    Relationship status: Not on file  . Intimate partner violence:    Fear of current or ex partner: Not on file    Emotionally abused: Not on file    Physically abused: Not on file    Forced sexual activity: Not on file  Other Topics Concern  . Not on file  Social History Narrative    Starting to  regular exercise.     Family History  Problem Relation Age of Onset  . Hypertension Mother   . Diabetes Father   . Prostate cancer Father [phone]    Outpatient Encounter Medications as of 03/15/2018  Medication Sig  . AMBULATORY NON FORMULARY MEDICATION Medication Name: CPAP with humidifier, tubing and supplies for new start. Set to AutoPap 2-20 cm water pressure.   See Home Sleep Study. Please fax download in 1-2 weeks so we can set the pressure.  Fax to [facility].   No facility-administered encounter medications on file as of 03/15/2018.           Objective:   Physical Exam  Constitutional: He is oriented to person, place, and time. He appears well-developed and well-nourished.  HENT:  Head: Normocephalic and atraumatic.  Right Ear: External ear normal.  Left Ear: External ear normal.  Nose: Nose normal.  Mouth/Throat: Oropharynx is clear and moist.  Eyes: Pupils are equal,  round, and reactive to light. Conjunctivae and EOM are normal.  Neck: Normal range of motion. Neck supple. No thyromegaly present.  Cardiovascular: Normal rate, regular rhythm, normal heart sounds and intact distal pulses.  Pulmonary/Chest: Effort normal and breath sounds normal.  Abdominal: Soft. Bowel sounds are normal. He exhibits no distension and no mass. There is no tenderness. There is no rebound and no guarding.  Musculoskeletal: Normal range of motion.  Lymphadenopathy:    He has no cervical adenopathy.  Neurological: He is alert and oriented to person, place, and time. He has normal reflexes.  Skin: Skin is warm and dry.  Psychiatric: He has a normal mood and affect. His behavior is normal. Judgment and thought content normal.        Assessment & Plan:  CPE Keep up a regular exercise program and make sure you are eating a healthy diet Try to eat 4 servings of dairy a day, or if you are lactose intolerant take a calcium with vitamin D daily.  Your vaccines are up to date.

## 2018-03-15 NOTE — Patient Instructions (Addendum)
Keep up a regular exercise program and make sure you are eating a healthy diet Try to eat 4 servings of dairy a day, or if you are lactose intolerant take a calcium with vitamin D daily.  Your vaccines are up to date.    Health Maintenance, Male A healthy lifestyle and preventive care is important for your health and wellness. Ask your health care provider about what schedule of regular examinations is right for you. What should I know about weight and diet? Eat a Healthy Diet  Eat plenty of vegetables, fruits, whole grains, low-fat dairy products, and lean protein.  Do not eat a lot of foods high in solid fats, added sugars, or salt.  Maintain a Healthy Weight Regular exercise can help you achieve or maintain a healthy weight. You should:  Do at least 150 minutes of exercise each week. The exercise should increase your heart rate and make you sweat (moderate-intensity exercise).  Do strength-training exercises at least twice a week.  Watch Your Levels of Cholesterol and Blood Lipids  Have your blood tested for lipids and cholesterol every 5 years starting at 35 years of age. If you are at high risk for heart disease, you should start having your blood tested when you are 36 years old. You may need to have your cholesterol levels checked more often if: ? Your lipid or cholesterol levels are high. ? You are older than 36 years of age. ? You are at high risk for heart disease.  What should I know about cancer screening? Many types of cancers can be detected early and may often be prevented. Lung Cancer  You should be screened every year for lung cancer if: ? You are a current smoker who has smoked for at least 30 years. ? You are a former smoker who has quit within the past 15 years.  Talk to your health care provider about your screening options, when you should start screening, and how often you should be screened.  Colorectal Cancer  Routine colorectal cancer screening  usually begins at 36 years of age and should be repeated every 5-10 years until you are 36 years old. You may need to be screened more often if early forms of precancerous polyps or small growths are found. Your health care provider may recommend screening at an earlier age if you have risk factors for colon cancer.  Your health care provider may recommend using home test kits to check for hidden blood in the stool.  A small camera at the end of a tube can be used to examine your colon (sigmoidoscopy or colonoscopy). This checks for the earliest forms of colorectal cancer.  Prostate and Testicular Cancer  Depending on your age and overall health, your health care provider may do certain tests to screen for prostate and testicular cancer.  Talk to your health care provider about any symptoms or concerns you have about testicular or prostate cancer.  Skin Cancer  Check your skin from head to toe regularly.  Tell your health care provider about any new moles or changes in moles, especially if: ? There is a change in a mole's size, shape, or color. ? You have a mole that is larger than a pencil eraser.  Always use sunscreen. Apply sunscreen liberally and repeat throughout the day.  Protect yourself by wearing long sleeves, pants, a wide-brimmed hat, and sunglasses when outside.  What should I know about heart disease, diabetes, and high blood pressure?  If you are   18-39 years of age, have your blood pressure checked every 3-5 years. If you are 40 years of age or older, have your blood pressure checked every year. You should have your blood pressure measured twice-once when you are at a hospital or clinic, and once when you are not at a hospital or clinic. Record the average of the two measurements. To check your blood pressure when you are not at a hospital or clinic, you can use: ? An automated blood pressure machine at a pharmacy. ? A home blood pressure monitor.  Talk to your health care  provider about your target blood pressure.  If you are between 45-79 years old, ask your health care provider if you should take aspirin to prevent heart disease.  Have regular diabetes screenings by checking your fasting blood sugar level. ? If you are at a normal weight and have a low risk for diabetes, have this test once every three years after the age of 45. ? If you are overweight and have a high risk for diabetes, consider being tested at a younger age or more often.  A one-time screening for abdominal aortic aneurysm (AAA) by ultrasound is recommended for men aged 65-75 years who are current or former smokers. What should I know about preventing infection? Hepatitis B If you have a higher risk for hepatitis B, you should be screened for this virus. Talk with your health care provider to find out if you are at risk for hepatitis B infection. Hepatitis C Blood testing is recommended for:  Everyone born from 1945 through 1965.  Anyone with known risk factors for hepatitis C.  Sexually Transmitted Diseases (STDs)  You should be screened each year for STDs including gonorrhea and chlamydia if: ? You are sexually active and are younger than 36 years of age. ? You are older than 36 years of age and your health care provider tells you that you are at risk for this type of infection. ? Your sexual activity has changed since you were last screened and you are at an increased risk for chlamydia or gonorrhea. Ask your health care provider if you are at risk.  Talk with your health care provider about whether you are at high risk of being infected with HIV. Your health care provider may recommend a prescription medicine to help prevent HIV infection.  What else can I do?  Schedule regular health, dental, and eye exams.  Stay current with your vaccines (immunizations).  Do not use any tobacco products, such as cigarettes, chewing tobacco, and e-cigarettes. If you need help quitting, ask  your health care provider.  Limit alcohol intake to no more than 2 drinks per day. One drink equals 12 ounces of beer, 5 ounces of wine, or 1 ounces of hard liquor.  Do not use street drugs.  Do not share needles.  Ask your health care provider for help if you need support or information about quitting drugs.  Tell your health care provider if you often feel depressed.  Tell your health care provider if you have ever been abused or do not feel safe at home. This information is not intended to replace advice given to you by your health care provider. Make sure you discuss any questions you have with your health care provider. Document Released: 12/12/2007 Document Revised: 02/12/2016 Document Reviewed: 03/19/2015 Elsevier Interactive Patient Education  2018 Elsevier Inc.  

## 2018-03-16 ENCOUNTER — Other Ambulatory Visit: Payer: Self-pay | Admitting: *Deleted

## 2018-03-16 DIAGNOSIS — R7401 Elevation of levels of liver transaminase levels: Secondary | ICD-10-CM

## 2018-03-16 DIAGNOSIS — R740 Nonspecific elevation of levels of transaminase and lactic acid dehydrogenase [LDH]: Secondary | ICD-10-CM

## 2018-04-14 DIAGNOSIS — R740 Nonspecific elevation of levels of transaminase and lactic acid dehydrogenase [LDH]: Secondary | ICD-10-CM | POA: Diagnosis not present

## 2018-04-14 LAB — HEPATIC FUNCTION PANEL
AG Ratio: 1.5 (calc) (ref 1.0–2.5)
ALKALINE PHOSPHATASE (APISO): 61 U/L (ref 40–115)
ALT: 44 U/L (ref 9–46)
AST: 22 U/L (ref 10–40)
Albumin: 4.8 g/dL (ref 3.6–5.1)
BILIRUBIN DIRECT: 0.2 mg/dL (ref 0.0–0.2)
BILIRUBIN INDIRECT: 0.6 mg/dL (ref 0.2–1.2)
GLOBULIN: 3.2 g/dL (ref 1.9–3.7)
TOTAL PROTEIN: 8 g/dL (ref 6.1–8.1)
Total Bilirubin: 0.8 mg/dL (ref 0.2–1.2)

## 2018-04-14 LAB — BASIC METABOLIC PANEL WITH GFR
BUN: 14 mg/dL (ref 7–25)
CO2: 32 mmol/L (ref 20–32)
Calcium: 10.1 mg/dL (ref 8.6–10.3)
Chloride: 102 mmol/L (ref 98–110)
Creat: 1.12 mg/dL (ref 0.60–1.35)
GFR, Est African American: 97 mL/min/{1.73_m2} (ref >=60)
GFR, Est Non African American: 84 mL/min/{1.73_m2} (ref >=60)
GLUCOSE: 96 mg/dL (ref 65–99)
POTASSIUM: 4.6 mmol/L (ref 3.5–5.3)
SODIUM: 139 mmol/L (ref 135–146)

## 2018-04-19 DIAGNOSIS — G4733 Obstructive sleep apnea (adult) (pediatric): Secondary | ICD-10-CM | POA: Diagnosis not present

## 2018-05-20 DIAGNOSIS — G4733 Obstructive sleep apnea (adult) (pediatric): Secondary | ICD-10-CM | POA: Diagnosis not present

## 2018-06-16 DIAGNOSIS — L910 Hypertrophic scar: Secondary | ICD-10-CM | POA: Diagnosis not present

## 2018-06-19 DIAGNOSIS — G4733 Obstructive sleep apnea (adult) (pediatric): Secondary | ICD-10-CM | POA: Diagnosis not present

## 2018-07-20 DIAGNOSIS — G4733 Obstructive sleep apnea (adult) (pediatric): Secondary | ICD-10-CM | POA: Diagnosis not present

## 2018-07-22 DIAGNOSIS — L910 Hypertrophic scar: Secondary | ICD-10-CM | POA: Diagnosis not present

## 2018-08-20 DIAGNOSIS — G4733 Obstructive sleep apnea (adult) (pediatric): Secondary | ICD-10-CM | POA: Diagnosis not present

## 2019-02-22 ENCOUNTER — Ambulatory Visit (INDEPENDENT_AMBULATORY_CARE_PROVIDER_SITE_OTHER): Payer: BC Managed Care – PPO | Admitting: Family Medicine

## 2019-02-22 ENCOUNTER — Other Ambulatory Visit: Payer: Self-pay

## 2019-02-22 ENCOUNTER — Encounter: Payer: Self-pay | Admitting: Family Medicine

## 2019-02-22 ENCOUNTER — Telehealth: Payer: Self-pay | Admitting: Family Medicine

## 2019-02-22 VITALS — BP 136/64 | HR 68 | Ht 71.0 in | Wt 203.0 lb

## 2019-02-22 DIAGNOSIS — G4733 Obstructive sleep apnea (adult) (pediatric): Secondary | ICD-10-CM | POA: Diagnosis not present

## 2019-02-22 DIAGNOSIS — Z0000 Encounter for general adult medical examination without abnormal findings: Secondary | ICD-10-CM | POA: Diagnosis not present

## 2019-02-22 DIAGNOSIS — E7800 Pure hypercholesterolemia, unspecified: Secondary | ICD-10-CM | POA: Diagnosis not present

## 2019-02-22 DIAGNOSIS — L910 Hypertrophic scar: Secondary | ICD-10-CM

## 2019-02-22 NOTE — Assessment & Plan Note (Signed)
Not using his CPAP.

## 2019-02-22 NOTE — Progress Notes (Signed)
Pt wanted to know if he can be tested for COVID.  He also reports that he is no longer using his CPAP.  Marland KitchenElouise Munroe, [place]

## 2019-02-22 NOTE — Addendum Note (Signed)
Addended by: Beatrice Lecher D on: 02/22/2019 02:52 PM   Modules accepted: Orders

## 2019-02-22 NOTE — Patient Instructions (Addendum)
Preventive Care 21-37 Years Old, Male Preventive care refers to lifestyle choices and visits with your health care provider that can promote health and wellness. This includes:  A yearly physical exam. This is also called an annual well check.  Regular dental and eye exams.  Immunizations.  Screening for certain conditions.  Healthy lifestyle choices, such as eating a healthy diet, getting regular exercise, not using drugs or products that contain nicotine and tobacco, and limiting alcohol use. What can I expect for my preventive care visit? Physical exam Your health care provider will check:  Height and weight. These may be used to calculate body mass index (BMI), which is a measurement that tells if you are at a healthy weight.  Heart rate and blood pressure.  Your skin for abnormal spots. Counseling Your health care provider may ask you questions about:  Alcohol, tobacco, and drug use.  Emotional well-being.  Home and relationship well-being.  Sexual activity.  Eating habits.  Work and work environment. What immunizations do I need?  Influenza (flu) vaccine  This is recommended every year. Tetanus, diphtheria, and pertussis (Tdap) vaccine  You may need a Td booster every 10 years. Varicella (chickenpox) vaccine  You may need this vaccine if you have not already been vaccinated. Human papillomavirus (HPV) vaccine  If recommended by your health care provider, you may need three doses over 6 months. Measles, mumps, and rubella (MMR) vaccine  You may need at least one dose of MMR. You may also need a second dose. Meningococcal conjugate (MenACWY) vaccine  One dose is recommended if you are 19-21 years old and a first-year college student living in a residence hall, or if you have one of several medical conditions. You may also need additional booster doses. Pneumococcal conjugate (PCV13) vaccine  You may need this if you have certain conditions and were not  previously vaccinated. Pneumococcal polysaccharide (PPSV23) vaccine  You may need one or two doses if you smoke cigarettes or if you have certain conditions. Hepatitis A vaccine  You may need this if you have certain conditions or if you travel or work in places where you may be exposed to hepatitis A. Hepatitis B vaccine  You may need this if you have certain conditions or if you travel or work in places where you may be exposed to hepatitis B. Haemophilus influenzae type b (Hib) vaccine  You may need this if you have certain risk factors. You may receive vaccines as individual doses or as more than one vaccine together in one shot (combination vaccines). Talk with your health care provider about the risks and benefits of combination vaccines. What tests do I need? Blood tests  Lipid and cholesterol levels. These may be checked every 5 years starting at age 20.  Hepatitis C test.  Hepatitis B test. Screening   Diabetes screening. This is done by checking your blood sugar (glucose) after you have not eaten for a while (fasting).  Sexually transmitted disease (STD) testing. Talk with your health care provider about your test results, treatment options, and if necessary, the need for more tests. Follow these instructions at home: Eating and drinking   Eat a diet that includes fresh fruits and vegetables, whole grains, lean protein, and low-fat dairy products.  Take vitamin and mineral supplements as recommended by your health care provider.  Do not drink alcohol if your health care provider tells you not to drink.  If you drink alcohol: ? Limit how much you have to 0-2   drinks a day. ? Be aware of how much alcohol is in your drink. In the [place] one drink equals one 12 oz bottle of beer (355 mL), one 5 oz glass of wine (148 mL), or one 1 oz glass of hard liquor (44 mL). Lifestyle  Take daily care of your teeth and gums.  Stay active. Exercise for at least 30 minutes on 5 or  more days each week.  Do not use any products that contain nicotine or tobacco, such as cigarettes, e-cigarettes, and chewing tobacco. If you need help quitting, ask your health care provider.  If you are sexually active, practice safe sex. Use a condom or other form of protection to prevent STIs (sexually transmitted infections). What's next?  Go to your health care provider once a year for a well check visit.  Ask your health care provider how often you should have your eyes and teeth checked.  Stay up to date on all vaccines. This information is not intended to replace advice given to you by your health care provider. Make sure you discuss any questions you have with your health care provider. Document Released: 08/11/2001 Document Revised: 06/09/2018 Document Reviewed: 06/09/2018 Elsevier Patient Education  2020 Elsevier Inc[employer]  

## 2019-02-22 NOTE — Progress Notes (Addendum)
Established Patient Office Visit - CPE  Subjective:  Patient ID: John Steele, male    DOB: 1981-04-10  Age: 38 y.o. MRN: [id number]  CC:  Chief Complaint  Patient presents with  . Annual Exam    HPI John Steele presents for wellness exam. He is not exercising regularly. Feels his diet is good. Got a new job this Spring and says he has been able to work from home and it has been less stressful.    Has been seeing dermatology in [place] and would like something for more local.  Saw that there was a new dermatology practice opening on 67, like to be referred there.  He is been getting steroid injections into the skin.  No past medical history on file.  Past Surgical History:  Procedure Laterality Date  . NO PAST SURGERIES      Family History  Problem Relation Age of Onset  . Hypertension Mother   . Diabetes Father   . Prostate cancer Father 23    Social History   Socioeconomic History  . Marital status: Married    Spouse name: Not on file  . Number of children: 1  . Years of education: Not on file  . Highest education level: Not on file  Occupational History  . Occupation: IT recruiting     CommentGlass blower/designer.   Social Needs  . Financial resource strain: Not on file  . Food insecurity    Worry: Not on file    Inability: Not on file  . Transportation needs    Medical: Not on file    Non-medical: Not on file  Tobacco Use  . Smoking status: Former Smoker    Types: Cigars  . Smokeless tobacco: Never Used  Substance and Sexual Activity  . Alcohol use: Yes    Alcohol/week: 6.0 standard drinks    Types: 6 Cans of beer per week  . Drug use: Not on file  . Sexual activity: Yes    Partners: Female  Lifestyle  . Physical activity    Days per week: Not on file    Minutes per session: Not on file  . Stress: Not on file  Relationships  . Social Herbalist on phone: Not on file    Gets together: Not on file    Attends religious service: Not  on file    Active member of club or organization: Not on file    Attends meetings of clubs or organizations: Not on file    Relationship status: Not on file  . Intimate partner violence    Fear of current or ex partner: Not on file    Emotionally abused: Not on file    Physically abused: Not on file    Forced sexual activity: Not on file  Other Topics Concern  . Not on file  Social History Narrative   Starting to  regular exercise.     Outpatient Medications Prior to Visit  Medication Sig Dispense Refill  . AMBULATORY NON FORMULARY MEDICATION Medication Name: CPAP with humidifier, tubing and supplies for new start. Set to AutoPap 2-20 cm water pressure.   See Home Sleep Study. Please fax download in 1-2 weeks so we can set the pressure.  Fax to [facility]. 1 vial 0   No facility-administered medications prior to visit.     No Known Allergies  ROS Review of Systems    Objective:    Physical Exam  Constitutional: He is oriented  to person, place, and time. He appears well-developed and well-nourished.  HENT:  Head: Normocephalic and atraumatic.  Right Ear: External ear normal.  Left Ear: External ear normal.  Nose: Nose normal.  Mouth/Throat: Oropharynx is clear and moist.  Eyes: Pupils are equal, round, and reactive to light. Conjunctivae and EOM are normal.  Neck: Normal range of motion. Neck supple. No thyromegaly present.  Cardiovascular: Normal rate, regular rhythm, normal heart sounds and intact distal pulses.  No carotid bruits  Pulmonary/Chest: Effort normal and breath sounds normal.  Abdominal: Soft. Bowel sounds are normal. He exhibits no distension and no mass. There is no abdominal tenderness. There is no rebound and no guarding.  Musculoskeletal: Normal range of motion.  Lymphadenopathy:    He has no cervical adenopathy.  Neurological: He is alert and oriented to person, place, and time. He has normal reflexes.  Skin: Skin is warm and dry.  Psychiatric: He has  a normal mood and affect. His behavior is normal. Judgment and thought content normal.    BP 136/64   Pulse 68   Ht 5\' 11"  (1.803 m)   Wt 203 lb (92.1 kg)   SpO2 100%   BMI 28.31 kg/m  Wt Readings from Last 3 Encounters:  02/22/19 203 lb (92.1 kg)  03/15/18 209 lb (94.8 kg)  02/15/18 208 lb (94.3 kg)     There are no preventive care reminders to display for this patient.  There are no preventive care reminders to display for this patient.  Lab Results  Component Value Date   TSH 0.798 12/13/2008   Lab Results  Component Value Date   WBC 6.4 01/04/2008   HGB 15.5 01/04/2008   HCT 47.0 01/04/2008   MCV 91.3 01/04/2008   PLT 189 01/04/2008   Lab Results  Component Value Date   NA 139 04/14/2018   K 4.6 04/14/2018   CO2 32 04/14/2018   GLUCOSE 96 04/14/2018   BUN 14 04/14/2018   CREATININE 1.12 04/14/2018   BILITOT 0.8 04/14/2018   ALKPHOS 68 04/17/2015   AST 22 04/14/2018   ALT 44 04/14/2018   PROT 8.0 04/14/2018   ALBUMIN 4.7 04/17/2015   CALCIUM 10.1 04/14/2018   Lab Results  Component Value Date   CHOL 188 03/15/2018   Lab Results  Component Value Date   HDL 49 03/15/2018   Lab Results  Component Value Date   LDLCALC 114 (H) 03/15/2018   Lab Results  Component Value Date   TRIG 132 03/15/2018   Lab Results  Component Value Date   CHOLHDL 3.8 03/15/2018   No results found for: HGBA1C    Assessment & Plan:   Problem List Items Addressed This Visit      Respiratory   OSA (obstructive sleep apnea)    Not using his CPAP.        Other Visit Diagnoses    Wellness examination    -  Primary   Relevant Orders   COMPLETE METABOLIC PANEL WITH GFR   Lipid Panel w/reflex Direct LDL   Keloid       Relevant Orders   Ambulatory referral to Dermatology     Keep up a regular exercise program and make sure you are eating a healthy diet Try to eat 4 servings of dairy a day, or if you are lactose intolerant take a calcium with vitamin D daily.   Your vaccines are up to date.   Dermatology referral placed.  No orders of the defined types  were placed in this encounter.   Follow-up: Return in about 1 year (around 02/22/2020) for wellness exam and labs .    John Gasser, MD

## 2019-02-22 NOTE — Telephone Encounter (Signed)
Left message for a return call to schedule

## 2019-02-22 NOTE — Telephone Encounter (Signed)
Please call pt: we can test him her in our office for COVID if he would like since he is asymtomatic.

## 2019-02-23 LAB — COMPLETE METABOLIC PANEL WITH GFR
AG Ratio: 1.4 (calc) (ref 1.0–2.5)
ALT: 46 U/L (ref 9–46)
AST: 24 U/L (ref 10–40)
Albumin: 4.6 g/dL (ref 3.6–5.1)
Alkaline phosphatase (APISO): 59 U/L (ref 36–130)
BUN: 14 mg/dL (ref 7–25)
CO2: 25 mmol/L (ref 20–32)
Calcium: 10.2 mg/dL (ref 8.6–10.3)
Chloride: 102 mmol/L (ref 98–110)
Creat: 1.03 mg/dL (ref 0.60–1.35)
GFR, Est African American: 108 mL/min/{1.73_m2} (ref >=60)
GFR, Est Non African American: 93 mL/min/{1.73_m2} (ref >=60)
Globulin: 3.3 g/dL (calc) (ref 1.9–3.7)
Glucose, Bld: 83 mg/dL (ref 65–99)
Potassium: 4.4 mmol/L (ref 3.5–5.3)
Sodium: 141 mmol/L (ref 135–146)
Total Bilirubin: 0.8 mg/dL (ref 0.2–1.2)
Total Protein: 7.9 g/dL (ref 6.1–8.1)

## 2019-02-23 LAB — LIPID PANEL W/REFLEX DIRECT LDL
Cholesterol: 190 mg/dL (ref <200)
HDL: 40 mg/dL (ref >=40)
LDL Cholesterol (Calc): 123 mg/dL (calc) — ABNORMAL HIGH
Non-HDL Cholesterol (Calc): 150 mg/dL (calc) — ABNORMAL HIGH (ref <130)
Total CHOL/HDL Ratio: 4.8 (calc) (ref <5.0)
Triglycerides: 152 mg/dL — ABNORMAL HIGH (ref <150)

## 2019-02-23 NOTE — Telephone Encounter (Signed)
Scheduled for 8/30

## 2020-01-17 DIAGNOSIS — L730 Acne keloid: Secondary | ICD-10-CM | POA: Diagnosis not present

## 2020-01-17 DIAGNOSIS — L910 Hypertrophic scar: Secondary | ICD-10-CM | POA: Diagnosis not present

## 2020-02-20 DIAGNOSIS — L910 Hypertrophic scar: Secondary | ICD-10-CM | POA: Diagnosis not present

## 2020-02-20 DIAGNOSIS — L730 Acne keloid: Secondary | ICD-10-CM | POA: Diagnosis not present

## 2020-02-26 ENCOUNTER — Ambulatory Visit (INDEPENDENT_AMBULATORY_CARE_PROVIDER_SITE_OTHER): Payer: BC Managed Care – PPO | Admitting: Family Medicine

## 2020-02-26 ENCOUNTER — Encounter: Payer: Self-pay | Admitting: Family Medicine

## 2020-02-26 VITALS — BP 135/82 | HR 68 | Ht 71.0 in | Wt 203.0 lb

## 2020-02-26 DIAGNOSIS — Z114 Encounter for screening for human immunodeficiency virus [HIV]: Secondary | ICD-10-CM | POA: Diagnosis not present

## 2020-02-26 DIAGNOSIS — Z8042 Family history of malignant neoplasm of prostate: Secondary | ICD-10-CM | POA: Diagnosis not present

## 2020-02-26 DIAGNOSIS — Z0000 Encounter for general adult medical examination without abnormal findings: Secondary | ICD-10-CM | POA: Diagnosis not present

## 2020-02-26 DIAGNOSIS — Z1159 Encounter for screening for other viral diseases: Secondary | ICD-10-CM | POA: Diagnosis not present

## 2020-02-26 NOTE — Patient Instructions (Signed)
Preventive Care 19-38 Years Old, Male Preventive care refers to lifestyle choices and visits with your health care provider that can promote health and wellness. This includes:  A yearly physical exam. This is also called an annual well check.  Regular dental and eye exams.  Immunizations.  Screening for certain conditions.  Healthy lifestyle choices, such as eating a healthy diet, getting regular exercise, not using drugs or products that contain nicotine and tobacco, and limiting alcohol use. What can I expect for my preventive care visit? Physical exam Your health care provider will check:  Height and weight. These may be used to calculate body mass index (BMI), which is a measurement that tells if you are at a healthy weight.  Heart rate and blood pressure.  Your skin for abnormal spots. Counseling Your health care provider may ask you questions about:  Alcohol, tobacco, and drug use.  Emotional well-being.  Home and relationship well-being.  Sexual activity.  Eating habits.  Work and work Statistician. What immunizations do I need?  Influenza (flu) vaccine  This is recommended every year. Tetanus, diphtheria, and pertussis (Tdap) vaccine  You may need a Td booster every 10 years. Varicella (chickenpox) vaccine  You may need this vaccine if you have not already been vaccinated. Human papillomavirus (HPV) vaccine  If recommended by your health care provider, you may need three doses over 6 months. Measles, mumps, and rubella (MMR) vaccine  You may need at least one dose of MMR. You may also need a second dose. Meningococcal conjugate (MenACWY) vaccine  One dose is recommended if you are 45-76 years old and a Market researcher living in a residence hall, or if you have one of several medical conditions. You may also need additional booster doses. Pneumococcal conjugate (PCV13) vaccine  You may need this if you have certain conditions and were not  previously vaccinated. Pneumococcal polysaccharide (PPSV23) vaccine  You may need one or two doses if you smoke cigarettes or if you have certain conditions. Hepatitis A vaccine  You may need this if you have certain conditions or if you travel or work in places where you may be exposed to hepatitis A. Hepatitis B vaccine  You may need this if you have certain conditions or if you travel or work in places where you may be exposed to hepatitis B. Haemophilus influenzae type b (Hib) vaccine  You may need this if you have certain risk factors. You may receive vaccines as individual doses or as more than one vaccine together in one shot (combination vaccines). Talk with your health care provider about the risks and benefits of combination vaccines. What tests do I need? Blood tests  Lipid and cholesterol levels. These may be checked every 5 years starting at age 17.  Hepatitis C test.  Hepatitis B test. Screening   Diabetes screening. This is done by checking your blood sugar (glucose) after you have not eaten for a while (fasting).  Sexually transmitted disease (STD) testing. Talk with your health care provider about your test results, treatment options, and if necessary, the need for more tests. Follow these instructions at home: Eating and drinking   Eat a diet that includes fresh fruits and vegetables, whole grains, lean protein, and low-fat dairy products.  Take vitamin and mineral supplements as recommended by your health care provider.  Do not drink alcohol if your health care provider tells you not to drink.  If you drink alcohol: ? Limit how much you have to 0-2  drinks a day. ? Be aware of how much alcohol is in your drink. In the [place] one drink equals one 12 oz bottle of beer (355 mL), one 5 oz glass of wine (148 mL), or one 1 oz glass of hard liquor (44 mL). Lifestyle  Take daily care of your teeth and gums.  Stay active. Exercise for at least 30 minutes on 5 or  more days each week.  Do not use any products that contain nicotine or tobacco, such as cigarettes, e-cigarettes, and chewing tobacco. If you need help quitting, ask your health care provider.  If you are sexually active, practice safe sex. Use a condom or other form of protection to prevent STIs (sexually transmitted infections). What's next?  Go to your health care provider once a year for a well check visit.  Ask your health care provider how often you should have your eyes and teeth checked.  Stay up to date on all vaccines. This information is not intended to replace advice given to you by your health care provider. Make sure you discuss any questions you have with your health care provider. Document Revised: 06/09/2018 Document Reviewed: 06/09/2018 Elsevier Patient Education  2020 [employer].

## 2020-02-26 NOTE — Progress Notes (Signed)
Established Patient Office Visit  Subjective:  Patient ID: John Steele, male    DOB: 10/05/1980  Age: 39 y.o. MRN: [id number]  CC:  Chief Complaint  Patient presents with  . Annual Exam    HPI John Steele presents for wellness.  He is actually doing really well he still working from home and says that is actually been working out.  He exercises regularly he runs about 1 mile per day.  He actually quit smoking in April of this year.  He sleeping well though he did stop his CPAP he has been trying to lose weight to control his snoring.  He is due for updated lab work.  No past medical history on file.  Past Surgical History:  Procedure Laterality Date  . NO PAST SURGERIES      Family History  Problem Relation Age of Onset  . Hypertension Mother   . Diabetes Father   . Prostate cancer Father 52    Social History   Socioeconomic History  . Marital status: Married    Spouse name: Not on file  . Number of children: 1  . Years of education: Not on file  . Highest education level: Not on file  Occupational History  . Occupation: IT recruiting     CommentTheatre stage manager.   Tobacco Use  . Smoking status: Former Smoker    Types: Cigars  . Smokeless tobacco: Never Used  Substance and Sexual Activity  . Alcohol use: Yes    Alcohol/week: 6.0 standard drinks    Types: 6 Cans of beer per week  . Drug use: Not on file  . Sexual activity: Yes    Partners: Female  Other Topics Concern  . Not on file  Social History Narrative   Starting to  regular exercise.    Social Determinants of Health   Financial Resource Strain:   . Difficulty of Paying Living Expenses: Not on file  Food Insecurity:   . Worried About Programme researcher, broadcasting/film/video in the Last Year: Not on file  . Ran Out of Food in the Last Year: Not on file  Transportation Needs:   . Lack of Transportation (Medical): Not on file  . Lack of Transportation (Non-Medical): Not on file  Physical Activity:   . Days of  Exercise per Week: Not on file  . Minutes of Exercise per Session: Not on file  Stress:   . Feeling of Stress : Not on file  Social Connections:   . Frequency of Communication with Friends and Family: Not on file  . Frequency of Social Gatherings with Friends and Family: Not on file  . Attends Religious Services: Not on file  . Active Member of Clubs or Organizations: Not on file  . Attends Banker Meetings: Not on file  . Marital Status: Not on file  Intimate Partner Violence:   . Fear of Current or Ex-Partner: Not on file  . Emotionally Abused: Not on file  . Physically Abused: Not on file  . Sexually Abused: Not on file    No outpatient medications prior to visit.   No facility-administered medications prior to visit.    No Known Allergies  ROS Review of Systems    Objective:    Physical Exam Constitutional:      Appearance: He is well-developed.  HENT:     Head: Normocephalic and atraumatic.     Right Ear: External ear normal.     Left Ear: External ear  normal.     Nose: Nose normal.  Eyes:     Conjunctiva/sclera: Conjunctivae normal.     Pupils: Pupils are equal, round, and reactive to light.  Neck:     Thyroid: No thyromegaly.  Cardiovascular:     Rate and Rhythm: Normal rate and regular rhythm.     Heart sounds: Normal heart sounds.  Pulmonary:     Effort: Pulmonary effort is normal.     Breath sounds: Normal breath sounds.  Abdominal:     General: Bowel sounds are normal. There is no distension.     Palpations: Abdomen is soft. There is no mass.     Tenderness: There is no abdominal tenderness. There is no guarding or rebound.  Musculoskeletal:        General: Normal range of motion.     Cervical back: Normal range of motion and neck supple.  Lymphadenopathy:     Cervical: No cervical adenopathy.  Skin:    General: Skin is warm and dry.  Neurological:     Mental Status: He is alert and oriented to person, place, and time.     Deep  Tendon Reflexes: Reflexes are normal and symmetric.  Psychiatric:        Behavior: Behavior normal.        Thought Content: Thought content normal.        Judgment: Judgment normal.     BP 135/82   Pulse 68   Ht 5\' 11"  (1.803 m)   Wt 203 lb (92.1 kg)   SpO2 100%   BMI 28.31 kg/m  Wt Readings from Last 3 Encounters:  02/26/20 203 lb (92.1 kg)  02/22/19 203 lb (92.1 kg)  03/15/18 209 lb (94.8 kg)     Health Maintenance Due  Topic Date Due  . Hepatitis C Screening  Never done    There are no preventive care reminders to display for this patient.  Lab Results  Component Value Date   TSH 0.798 12/13/2008   Lab Results  Component Value Date   WBC 6.4 01/04/2008   HGB 15.5 01/04/2008   HCT 47.0 01/04/2008   MCV 91.3 01/04/2008   PLT 189 01/04/2008   Lab Results  Component Value Date   NA 141 02/22/2019   K 4.4 02/22/2019   CO2 25 02/22/2019   GLUCOSE 83 02/22/2019   BUN 14 02/22/2019   CREATININE 1.03 02/22/2019   BILITOT 0.8 02/22/2019   ALKPHOS 68 04/17/2015   AST 24 02/22/2019   ALT 46 02/22/2019   PROT 7.9 02/22/2019   ALBUMIN 4.7 04/17/2015   CALCIUM 10.2 02/22/2019   Lab Results  Component Value Date   CHOL 190 02/22/2019   Lab Results  Component Value Date   HDL 40 02/22/2019   Lab Results  Component Value Date   LDLCALC 123 (H) 02/22/2019   Lab Results  Component Value Date   TRIG 152 (H) 02/22/2019   Lab Results  Component Value Date   CHOLHDL 4.8 02/22/2019   No results found for: HGBA1C    Assessment & Plan:   Problem List Items Addressed This Visit      Other   Family history of prostate cancer   Relevant Orders   COMPLETE METABOLIC PANEL WITH GFR   Lipid panel   PSA   Hepatitis C antibody   HIV Antibody (routine testing w rflx)    Other Visit Diagnoses    Wellness examination    -  Primary   Relevant Orders  COMPLETE METABOLIC PANEL WITH GFR   Lipid panel   PSA   Hepatitis C antibody   HIV Antibody (routine  testing w rflx)   Need for hepatitis C screening test       Relevant Orders   COMPLETE METABOLIC PANEL WITH GFR   Lipid panel   PSA   Hepatitis C antibody   HIV Antibody (routine testing w rflx)   Screening for HIV without presence of risk factors       Relevant Orders   COMPLETE METABOLIC PANEL WITH GFR   Lipid panel   PSA   Hepatitis C antibody   HIV Antibody (routine testing w rflx)     Keep up a regular exercise program and make sure you are eating a healthy diet Try to eat 4 servings of dairy a day, or if you are lactose intolerant take a calcium with vitamin D daily.  Your vaccines are up to date.   + fam hx of prostate canc in 40s. Will check PSA  No orders of the defined types were placed in this encounter.   Follow-up: Return if symptoms worsen or fail to improve.    [name], MD

## 2020-03-19 DIAGNOSIS — L730 Acne keloid: Secondary | ICD-10-CM | POA: Diagnosis not present

## 2020-03-19 DIAGNOSIS — L910 Hypertrophic scar: Secondary | ICD-10-CM | POA: Diagnosis not present

## 2020-03-22 DIAGNOSIS — Z114 Encounter for screening for human immunodeficiency virus [HIV]: Secondary | ICD-10-CM | POA: Diagnosis not present

## 2020-03-22 DIAGNOSIS — Z1159 Encounter for screening for other viral diseases: Secondary | ICD-10-CM | POA: Diagnosis not present

## 2020-03-22 DIAGNOSIS — Z8042 Family history of malignant neoplasm of prostate: Secondary | ICD-10-CM | POA: Diagnosis not present

## 2020-03-22 DIAGNOSIS — Z0000 Encounter for general adult medical examination without abnormal findings: Secondary | ICD-10-CM | POA: Diagnosis not present

## 2020-03-25 LAB — COMPLETE METABOLIC PANEL WITH GFR
AG Ratio: 1.5 (calc) (ref 1.0–2.5)
ALT: 49 U/L — ABNORMAL HIGH (ref 9–46)
AST: 21 U/L (ref 10–40)
Albumin: 4.7 g/dL (ref 3.6–5.1)
Alkaline phosphatase (APISO): 53 U/L (ref 36–130)
BUN: 15 mg/dL (ref 7–25)
CO2: 27 mmol/L (ref 20–32)
Calcium: 10.1 mg/dL (ref 8.6–10.3)
Chloride: 102 mmol/L (ref 98–110)
Creat: 0.99 mg/dL (ref 0.60–1.35)
GFR, Est African American: 112 mL/min/{1.73_m2} (ref >=60)
GFR, Est Non African American: 96 mL/min/{1.73_m2} (ref >=60)
Globulin: 3.1 g/dL (calc) (ref 1.9–3.7)
Glucose, Bld: 90 mg/dL (ref 65–99)
Potassium: 4.3 mmol/L (ref 3.5–5.3)
Sodium: 139 mmol/L (ref 135–146)
Total Bilirubin: 0.6 mg/dL (ref 0.2–1.2)
Total Protein: 7.8 g/dL (ref 6.1–8.1)

## 2020-03-25 LAB — LIPID PANEL
Cholesterol: 193 mg/dL (ref <200)
HDL: 59 mg/dL (ref >=40)
LDL Cholesterol (Calc): 112 mg/dL (calc) — ABNORMAL HIGH
Non-HDL Cholesterol (Calc): 134 mg/dL (calc) — ABNORMAL HIGH (ref <130)
Total CHOL/HDL Ratio: 3.3 (calc) (ref <5.0)
Triglycerides: 117 mg/dL (ref <150)

## 2020-03-25 LAB — HEPATITIS C ANTIBODY
Hepatitis C Ab: NONREACTIVE
SIGNAL TO CUT-OFF: 0.01 (ref <1.00)

## 2020-03-25 LAB — PSA: PSA: 0.87 ng/mL (ref <=4.0)

## 2020-04-17 DIAGNOSIS — L910 Hypertrophic scar: Secondary | ICD-10-CM | POA: Diagnosis not present

## 2020-04-17 DIAGNOSIS — L730 Acne keloid: Secondary | ICD-10-CM | POA: Diagnosis not present

## 2020-05-28 DIAGNOSIS — L730 Acne keloid: Secondary | ICD-10-CM | POA: Diagnosis not present

## 2020-05-28 DIAGNOSIS — L910 Hypertrophic scar: Secondary | ICD-10-CM | POA: Diagnosis not present

## 2020-06-13 ENCOUNTER — Telehealth (INDEPENDENT_AMBULATORY_CARE_PROVIDER_SITE_OTHER): Payer: BC Managed Care – PPO | Admitting: Nurse Practitioner

## 2020-06-13 ENCOUNTER — Encounter: Payer: Self-pay | Admitting: Nurse Practitioner

## 2020-06-13 DIAGNOSIS — J040 Acute laryngitis: Secondary | ICD-10-CM | POA: Diagnosis not present

## 2020-06-13 DIAGNOSIS — J069 Acute upper respiratory infection, unspecified: Secondary | ICD-10-CM

## 2020-06-13 NOTE — Patient Instructions (Signed)
Laryngitis Laryngitis is irritation and swelling (inflammation) of your vocal cords. This condition causes symptoms such as:  A change in your voice. It may sound low and hoarse.  Loss of voice.  Coughing.  Sore throat.  Dry throat.  Stuffy nose. Depending on the cause, this condition may go away after a short time or may last for more than 3 weeks. Treatment often involves resting your voice and using medicines to soothe your throat. Follow these instructions at home: Medicines  Take over-the-counter and prescription medicines only as told by your doctor.  If you were prescribed an antibiotic medicine, take it as told by your doctor. Do not stop taking it even if you start to feel better. General instructions  Talk as little as possible. Also avoid whispering.  Write instead of talking. Do this until your voice is back to normal.  Drink enough fluid to keep your pee (urine) pale yellow.  Breathe in moist air. Use a humidifier if you live in a dry climate.  Do not use any products that have nicotine or tobacco in them, such as cigarettes and e-cigarettes. If you need help quitting, ask your doctor. Contact a doctor if:  You have a fever.  Your pain is worse.  Your symptoms do not get better in 2 weeks. Get help right away if:  You cough up blood.  You have trouble swallowing.  You have trouble breathing. Summary  Laryngitis is inflammation of your vocal cords.  This condition causes your voice to sound low and hoarse.  Rest your voice by talking as little as possible. Also avoid whispering. This information is not intended to replace advice given to you by your health care provider. Make sure you discuss any questions you have with your health care provider. Document Revised: 06/02/2017 Document Reviewed: 06/02/2017 Elsevier Patient Education  2020 [employer]  Upper Respiratory Infection, Adult An upper respiratory infection (URI) affects the nose,  throat, and upper air passages. URIs are caused by germs (viruses). The most common type of URI is often called "the common cold." Medicines cannot cure URIs, but you can do things at home to relieve your symptoms. URIs usually get better within 7-10 days. Follow these instructions at home: Activity  Rest as needed.  If you have a fever, stay home from work or school until your fever is gone, or until your doctor says you may return to work or school. ? You should stay home until you cannot spread the infection anymore (you are not contagious). ? Your doctor may have you wear a face mask so you have less risk of spreading the infection. Relieving symptoms  Gargle with a salt-water mixture 3-4 times a day or as needed. To make a salt-water mixture, completely dissolve -1 tsp of salt in 1 cup of warm water.  Use a cool-mist humidifier to add moisture to the air. This can help you breathe more easily. Eating and drinking   Drink enough fluid to keep your pee (urine) pale yellow.  Eat soups and other clear broths. General instructions   Take over-the-counter and prescription medicines only as told by your doctor. These include cold medicines, fever reducers, and cough suppressants.  Do not use any products that contain nicotine or tobacco. These include cigarettes and e-cigarettes. If you need help quitting, ask your doctor.  Avoid being where people are smoking (avoid secondhand smoke).  Make sure you get regular shots and get the flu shot every year.  Keep all follow-up  visits as told by your doctor. This is important. How to avoid spreading infection to others   Wash your hands often with soap and water. If you do not have soap and water, use hand sanitizer.  Avoid touching your mouth, face, eyes, or nose.  Cough or sneeze into a tissue or your sleeve or elbow. Do not cough or sneeze into your hand or into the air. Contact a doctor if:  You are getting worse, not  better.  You have any of these: ? A fever. ? Chills. ? Brown or red mucus in your nose. ? Yellow or brown fluid (discharge)coming from your nose. ? Pain in your face, especially when you bend forward. ? Swollen neck glands. ? Pain with swallowing. ? White areas in the back of your throat. Get help right away if:  You have shortness of breath that gets worse.  You have very bad or constant: ? Headache. ? Ear pain. ? Pain in your forehead, behind your eyes, and over your cheekbones (sinus pain). ? Chest pain.  You have long-lasting (chronic) lung disease along with any of these: ? Wheezing. ? Long-lasting cough. ? Coughing up blood. ? A change in your usual mucus.  You have a stiff neck.  You have changes in your: ? Vision. ? Hearing. ? Thinking. ? Mood. Summary  An upper respiratory infection (URI) is caused by a germ called a virus. The most common type of URI is often called "the common cold."  URIs usually get better within 7-10 days.  Take over-the-counter and prescription medicines only as told by your doctor. This information is not intended to replace advice given to you by your health care provider. Make sure you discuss any questions you have with your health care provider. Document Revised: 06/23/2018 Document Reviewed: 02/05/2017 Elsevier Patient Education  2020 [employer].

## 2020-06-13 NOTE — Progress Notes (Signed)
Virtual Video Visit via MyChart Note- CONVERTED TO TELEPHONE VISIT VIA MYCHART  I connected with  John Steele on 06/13/20 at 10:50 AM EST by the video enabled telemedicine application for , MyChart, and verified that I am speaking with the correct person using two identifiers.   I introduced myself as a Publishing rights manager with the practice. We discussed the limitations of evaluation and management by telemedicine and the availability of in person appointments. The patient expressed understanding and agreed to proceed.  Participating parties in this visit include: The patient and nurse practitioner listed The patient is: at home I am: in the office  Subjective:    CC: Viral URI  HPI: John Steele is a 39 y.o. y/o male presenting via MyChart today for laryngitis, sinus congestion, post nasal drip, headache, and mild cough. His symptoms first started Monday night and have slowly worsened over time.   Denies sore throat, loss of appetite, fever, chills, loss of taste, loss of smell, shortness of breath, wheezing, or breathing difficulties.   Took at home COVID test this morning and it was negative.   Past medical history, Surgical history, Family history not pertinant except as noted below, Social history, Allergies, and medications have been entered into the medical record, reviewed, and corrections made.   Review of Systems:  All review of systems negative except what is listed in the HPI  Objective:    General: Laryngitis present.  Speaking in complete sentences without any shortness of breath.   Mild audible congestion present.  Alert and oriented x3.   Normal judgment.  No apparent acute distress.   Impression and Recommendations:   1. Laryngitis, acute 2. Viral URI Symptoms and presentation consistent viral URI with laryngitis.  Discussed with patient that at this time his symptoms appear to be of viral origin and OTC treatment regimens are recommended.  Discussed  recommendations and provided patient with a list in AVS.  Discussed warning signs to be alert for that could indicate possible bacterial infection starting and emergency symptoms that would warrant further evaluation in the emergency room.  If symptoms persist after the weekend, please follow-up with this office.  Work note provided for today and tomorrow.     I discussed the assessment and treatment plan with the patient. The patient was provided an opportunity to ask questions and all were answered. The patient agreed with the plan and demonstrated an understanding of the instructions.   The patient was advised to call back or seek an in-person evaluation if the symptoms worsen or if the condition fails to improve as anticipated.  I provided 20 minutes of non-face-to-face interaction with this MYCHART visit including intake, same-day documentation, and chart review.   Tollie Eth, NP

## 2020-07-02 DIAGNOSIS — L730 Acne keloid: Secondary | ICD-10-CM | POA: Diagnosis not present

## 2020-07-02 DIAGNOSIS — L910 Hypertrophic scar: Secondary | ICD-10-CM | POA: Diagnosis not present

## 2020-07-30 DIAGNOSIS — L910 Hypertrophic scar: Secondary | ICD-10-CM | POA: Diagnosis not present

## 2020-07-30 DIAGNOSIS — L730 Acne keloid: Secondary | ICD-10-CM | POA: Diagnosis not present

## 2020-10-01 DIAGNOSIS — L910 Hypertrophic scar: Secondary | ICD-10-CM | POA: Diagnosis not present

## 2020-12-03 DIAGNOSIS — S60862A Insect bite (nonvenomous) of left wrist, initial encounter: Secondary | ICD-10-CM | POA: Diagnosis not present

## 2020-12-03 DIAGNOSIS — L209 Atopic dermatitis, unspecified: Secondary | ICD-10-CM | POA: Diagnosis not present

## 2020-12-03 DIAGNOSIS — L910 Hypertrophic scar: Secondary | ICD-10-CM | POA: Diagnosis not present

## 2020-12-03 DIAGNOSIS — L730 Acne keloid: Secondary | ICD-10-CM | POA: Diagnosis not present

## 2021-02-26 ENCOUNTER — Encounter: Payer: BC Managed Care – PPO | Admitting: Family Medicine

## 2021-02-26 DIAGNOSIS — L730 Acne keloid: Secondary | ICD-10-CM | POA: Diagnosis not present

## 2021-02-26 DIAGNOSIS — L910 Hypertrophic scar: Secondary | ICD-10-CM | POA: Diagnosis not present

## 2021-02-26 NOTE — Progress Notes (Deleted)
CPE  Established Patient Office Visit  Subjective:  Patient ID: John Steele, male    DOB: [birth year]  Age: 40 y.o. MRN: [id number]  CC: No chief complaint on file.   HPI John Steele presents for CPE.  No past medical history on file.  Past Surgical History:  Procedure Laterality Date   NO PAST SURGERIES      Family History  Problem Relation Age of Onset   Hypertension Mother    Diabetes Father    Prostate cancer Father 18    Social History   Socioeconomic History   Marital status: Married    Spouse name: Not on file   Number of children: 1   Years of education: Not on file   Highest education level: Not on file  Occupational History   Occupation: IT recruiting     Comment: [employer].   Tobacco Use   Smoking status: Former    Types: Cigars   Smokeless tobacco: Never  Substance and Sexual Activity   Alcohol use: Yes    Alcohol/week: 6.0 standard drinks    Types: 6 Cans of beer per week   Drug use: Not on file   Sexual activity: Yes    Partners: Female  Other Topics Concern   Not on file  Social History Narrative   Starting to  regular exercise.    Social Determinants of Health   Financial Resource Strain: Not on file  Food Insecurity: Not on file  Transportation Needs: Not on file  Physical Activity: Not on file  Stress: Not on file  Social Connections: Not on file  Intimate Partner Violence: Not on file    No outpatient medications prior to visit.   No facility-administered medications prior to visit.    No Known Allergies  ROS Review of Systems    Objective:    Physical Exam Constitutional:      Appearance: He is well-developed.  HENT:     Head: Normocephalic and atraumatic.     Right Ear: External ear normal.     Left Ear: External ear normal.     Nose: Nose normal.  Eyes:     Conjunctiva/sclera: Conjunctivae normal.     Pupils: Pupils are equal, round, and reactive to light.  Neck:     Thyroid: No thyromegaly.   Cardiovascular:     Rate and Rhythm: Normal rate and regular rhythm.     Heart sounds: Normal heart sounds.  Pulmonary:     Effort: Pulmonary effort is normal.     Breath sounds: Normal breath sounds.  Abdominal:     General: Bowel sounds are normal. There is no distension.     Palpations: Abdomen is soft. There is no mass.     Tenderness: There is no abdominal tenderness. There is no guarding or rebound.  Musculoskeletal:        General: Normal range of motion.     Cervical back: Normal range of motion and neck supple.  Lymphadenopathy:     Cervical: No cervical adenopathy.  Skin:    General: Skin is warm and dry.  Neurological:     Mental Status: He is alert and oriented to person, place, and time.     Deep Tendon Reflexes: Reflexes are normal and symmetric.  Psychiatric:        Behavior: Behavior normal.        Thought Content: Thought content normal.        Judgment: Judgment normal.    There  were no vitals taken for this visit. Wt Readings from Last 3 Encounters:  02/26/20 203 lb (92.1 kg)  02/22/19 203 lb (92.1 kg)  03/15/18 209 lb (94.8 kg)     Health Maintenance Due  Topic Date Due   COVID-19 Vaccine (2 - Pfizer series) 10/19/2019   INFLUENZA VACCINE  01/27/2021    There are no preventive care reminders to display for this patient.  Lab Results  Component Value Date   TSH 0.798 12/13/2008   Lab Results  Component Value Date   WBC 6.4 01/04/2008   HGB 15.5 01/04/2008   HCT 47.0 01/04/2008   MCV 91.3 01/04/2008   PLT 189 01/04/2008   Lab Results  Component Value Date   NA 139 03/22/2020   K 4.3 03/22/2020   CO2 27 03/22/2020   GLUCOSE 90 03/22/2020   BUN 15 03/22/2020   CREATININE 0.99 03/22/2020   BILITOT 0.6 03/22/2020   ALKPHOS 68 04/17/2015   AST 21 03/22/2020   ALT 49 (H) 03/22/2020   PROT 7.8 03/22/2020   ALBUMIN 4.7 04/17/2015   CALCIUM 10.1 03/22/2020   Lab Results  Component Value Date   CHOL 193 03/22/2020   Lab Results   Component Value Date   HDL 59 03/22/2020   Lab Results  Component Value Date   LDLCALC 112 (H) 03/22/2020   Lab Results  Component Value Date   TRIG 117 03/22/2020   Lab Results  Component Value Date   CHOLHDL 3.3 03/22/2020   No results found for: HGBA1C    Assessment & Plan:   Problem List Items Addressed This Visit   None Visit Diagnoses     Wellness examination    -  Primary      Keep up a regular exercise program and make sure you are eating a healthy diet Try to eat 4 servings of dairy a day, or if you are lactose intolerant take a calcium with vitamin D daily.  Your vaccines are up to date.    No orders of the defined types were placed in this encounter.   Follow-up: No follow-ups on file.    [name], MD

## 2021-03-07 ENCOUNTER — Ambulatory Visit (INDEPENDENT_AMBULATORY_CARE_PROVIDER_SITE_OTHER): Payer: BC Managed Care – PPO | Admitting: Family Medicine

## 2021-03-07 ENCOUNTER — Encounter: Payer: Self-pay | Admitting: Family Medicine

## 2021-03-07 VITALS — BP 138/87 | HR 57 | Ht 71.0 in | Wt 204.0 lb

## 2021-03-07 DIAGNOSIS — Z8042 Family history of malignant neoplasm of prostate: Secondary | ICD-10-CM

## 2021-03-07 DIAGNOSIS — Z0000 Encounter for general adult medical examination without abnormal findings: Secondary | ICD-10-CM

## 2021-03-07 NOTE — Progress Notes (Signed)
CPE  Subjective:  Patient ID: John Steele, male    DOB: 1981-05-03  Age: 40 y.o. MRN: [id number]  CC:  Chief Complaint  Patient presents with   Annual Exam    HPI CHRISTHOPER BUSBEE presents for CPE.  He is doing really well overall no specific concerns.  He is no longer smoking cigars and has been exercising 5 days/week running approximately 2 miles per day and doing some calisthenics and stretching.  He still would like to have his PSA checked as he has a family history of prostate cancer.  Diet is fair.  No past medical history on file.  Past Surgical History:  Procedure Laterality Date   NO PAST SURGERIES      Family History  Problem Relation Age of Onset   Hypertension Mother    Diabetes Father    Prostate cancer Father 49    Social History   Socioeconomic History   Marital status: Married    Spouse name: Not on file   Number of children: 1   Years of education: Not on file   Highest education level: Not on file  Occupational History   Occupation: IT recruiting     Comment: [employer].   Tobacco Use   Smoking status: Former    Types: Cigars   Smokeless tobacco: Never  Substance and Sexual Activity   Alcohol use: Yes    Alcohol/week: 6.0 standard drinks    Types: 6 Cans of beer per week   Drug use: Not on file   Sexual activity: Yes    Partners: Female  Other Topics Concern   Not on file  Social History Narrative   Starting to  regular exercise.    Social Determinants of Health   Financial Resource Strain: Not on file  Food Insecurity: Not on file  Transportation Needs: Not on file  Physical Activity: Not on file  Stress: Not on file  Social Connections: Not on file  Intimate Partner Violence: Not on file    No outpatient medications prior to visit.   No facility-administered medications prior to visit.    No Known Allergies  ROS Review of Systems    Objective:    Physical Exam Constitutional:      Appearance: He is  well-developed.  HENT:     Head: Normocephalic and atraumatic.     Right Ear: External ear normal.     Left Ear: External ear normal.     Nose: Nose normal.  Eyes:     Conjunctiva/sclera: Conjunctivae normal.     Pupils: Pupils are equal, round, and reactive to light.  Neck:     Thyroid: No thyromegaly.  Cardiovascular:     Rate and Rhythm: Normal rate and regular rhythm.     Heart sounds: Normal heart sounds.  Pulmonary:     Effort: Pulmonary effort is normal.     Breath sounds: Normal breath sounds.  Abdominal:     General: Bowel sounds are normal. There is no distension.     Palpations: Abdomen is soft. There is no mass.     Tenderness: There is no abdominal tenderness. There is no guarding or rebound.  Musculoskeletal:        General: Normal range of motion.     Cervical back: Normal range of motion and neck supple.  Lymphadenopathy:     Cervical: No cervical adenopathy.  Skin:    General: Skin is warm and dry.  Neurological:     Mental  Status: He is alert and oriented to person, place, and time.     Deep Tendon Reflexes: Reflexes are normal and symmetric.  Psychiatric:        Behavior: Behavior normal.        Thought Content: Thought content normal.        Judgment: Judgment normal.    BP 138/87   Pulse (!) 57   Ht 5\' 11"  (1.803 m)   Wt 204 lb 0.6 oz (92.6 kg)   SpO2 100%   BMI 28.46 kg/m  Wt Readings from Last 3 Encounters:  03/07/21 204 lb 0.6 oz (92.6 kg)  02/26/20 203 lb (92.1 kg)  02/22/19 203 lb (92.1 kg)     Health Maintenance Due  Topic Date Due   COVID-19 Vaccine (2 - Pfizer series) 10/19/2019   INFLUENZA VACCINE  Never done    There are no preventive care reminders to display for this patient.  Lab Results  Component Value Date   TSH 0.798 12/13/2008   Lab Results  Component Value Date   WBC 6.4 01/04/2008   HGB 15.5 01/04/2008   HCT 47.0 01/04/2008   MCV 91.3 01/04/2008   PLT 189 01/04/2008   Lab Results  Component Value Date    NA 139 03/22/2020   K 4.3 03/22/2020   CO2 27 03/22/2020   GLUCOSE 90 03/22/2020   BUN 15 03/22/2020   CREATININE 0.99 03/22/2020   BILITOT 0.6 03/22/2020   ALKPHOS 68 04/17/2015   AST 21 03/22/2020   ALT 49 (H) 03/22/2020   PROT 7.8 03/22/2020   ALBUMIN 4.7 04/17/2015   CALCIUM 10.1 03/22/2020   Lab Results  Component Value Date   CHOL 193 03/22/2020   Lab Results  Component Value Date   HDL 59 03/22/2020   Lab Results  Component Value Date   LDLCALC 112 (H) 03/22/2020   Lab Results  Component Value Date   TRIG 117 03/22/2020   Lab Results  Component Value Date   CHOLHDL 3.3 03/22/2020   No results found for: HGBA1C    Assessment & Plan:   Problem List Items Addressed This Visit       Other   Family history of prostate cancer   Relevant Orders   PSA   Other Visit Diagnoses     Wellness examination    -  Primary   Relevant Orders   PSA   Lipid panel   COMPLETE METABOLIC PANEL WITH GFR   CBC   Hemoglobin A1c      Keep up a regular exercise program and make sure you are eating a healthy diet Try to eat 4 servings of dairy a day, or if you are lactose intolerant take a calcium with vitamin D daily.  Your vaccines are up to date.    No orders of the defined types were placed in this encounter.   Follow-up: Return in about 1 year (around 03/07/2022) for Wellness Exam.    05/07/2022, MD

## 2021-04-04 DIAGNOSIS — Z0000 Encounter for general adult medical examination without abnormal findings: Secondary | ICD-10-CM | POA: Diagnosis not present

## 2021-04-04 DIAGNOSIS — Z8042 Family history of malignant neoplasm of prostate: Secondary | ICD-10-CM | POA: Diagnosis not present

## 2021-04-05 LAB — HEMOGLOBIN A1C
Hgb A1c MFr Bld: 5.4 % of total Hgb (ref <5.7)
Mean Plasma Glucose: 108 mg/dL
eAG (mmol/L): 6 mmol/L

## 2021-04-05 LAB — COMPLETE METABOLIC PANEL WITH GFR
AG Ratio: 1.5 (calc) (ref 1.0–2.5)
ALT: 38 U/L (ref 9–46)
AST: 24 U/L (ref 10–40)
Albumin: 4.8 g/dL (ref 3.6–5.1)
Alkaline phosphatase (APISO): 58 U/L (ref 36–130)
BUN: 15 mg/dL (ref 7–25)
CO2: 28 mmol/L (ref 20–32)
Calcium: 9.8 mg/dL (ref 8.6–10.3)
Chloride: 102 mmol/L (ref 98–110)
Creat: 1.16 mg/dL (ref 0.60–1.26)
Globulin: 3.1 g/dL (calc) (ref 1.9–3.7)
Glucose, Bld: 97 mg/dL (ref 65–99)
Potassium: 4.2 mmol/L (ref 3.5–5.3)
Sodium: 138 mmol/L (ref 135–146)
Total Bilirubin: 0.7 mg/dL (ref 0.2–1.2)
Total Protein: 7.9 g/dL (ref 6.1–8.1)
eGFR: 82 mL/min/{1.73_m2} (ref >=60)

## 2021-04-05 LAB — CBC
HCT: 46.3 % (ref 38.5–50.0)
Hemoglobin: 16 g/dL (ref 13.2–17.1)
MCH: 30.9 pg (ref 27.0–33.0)
MCHC: 34.6 g/dL (ref 32.0–36.0)
MCV: 89.4 fL (ref 80.0–100.0)
MPV: 10.4 fL (ref 7.5–12.5)
Platelets: 293 10*3/uL (ref 140–400)
RBC: 5.18 10*6/uL (ref 4.20–5.80)
RDW: 12.4 % (ref 11.0–15.0)
WBC: 7.1 10*3/uL (ref 3.8–10.8)

## 2021-04-05 LAB — PSA: PSA: 0.84 ng/mL (ref <=4.00)

## 2021-04-05 LAB — LIPID PANEL
Cholesterol: 201 mg/dL — ABNORMAL HIGH (ref <200)
HDL: 55 mg/dL (ref >=40)
LDL Cholesterol (Calc): 126 mg/dL (calc) — ABNORMAL HIGH
Non-HDL Cholesterol (Calc): 146 mg/dL (calc) — ABNORMAL HIGH (ref <130)
Total CHOL/HDL Ratio: 3.7 (calc) (ref <5.0)
Triglycerides: 95 mg/dL (ref <150)

## 2021-04-07 NOTE — Progress Notes (Signed)
Call patient: Prostate test is normal.  Total cholesterol and LDL are elevated.  Jumped up slightly compared to last time.  Just continue work on healthy diet regular exercise.  Blood count metabolic panel look great. A1C is normal. No sign of diabetes.

## 2021-05-07 DIAGNOSIS — L910 Hypertrophic scar: Secondary | ICD-10-CM | POA: Diagnosis not present

## 2021-05-07 DIAGNOSIS — L730 Acne keloid: Secondary | ICD-10-CM | POA: Diagnosis not present

## 2021-10-21 DIAGNOSIS — L910 Hypertrophic scar: Secondary | ICD-10-CM | POA: Diagnosis not present

## 2021-12-22 DIAGNOSIS — L910 Hypertrophic scar: Secondary | ICD-10-CM | POA: Diagnosis not present

## 2021-12-30 ENCOUNTER — Encounter: Payer: Self-pay | Admitting: Family Medicine

## 2022-01-25 ENCOUNTER — Emergency Department (INDEPENDENT_AMBULATORY_CARE_PROVIDER_SITE_OTHER): Payer: BC Managed Care – PPO

## 2022-01-25 ENCOUNTER — Emergency Department
Admission: RE | Admit: 2022-01-25 | Discharge: 2022-01-25 | Disposition: A | Discharge status: Home / Self Care | Payer: BC Managed Care – PPO | Source: Ambulatory Visit | Attending: Family Medicine | Admitting: Family Medicine

## 2022-01-25 ENCOUNTER — Other Ambulatory Visit: Payer: Self-pay

## 2022-01-25 VITALS — BP 158/107 | HR 74 | Temp 97.9°F | Resp 18

## 2022-01-25 DIAGNOSIS — M7989 Other specified soft tissue disorders: Secondary | ICD-10-CM | POA: Diagnosis not present

## 2022-01-25 DIAGNOSIS — S81802A Unspecified open wound, left lower leg, initial encounter: Secondary | ICD-10-CM

## 2022-01-25 DIAGNOSIS — M25572 Pain in left ankle and joints of left foot: Secondary | ICD-10-CM

## 2022-01-25 DIAGNOSIS — M25472 Effusion, left ankle: Secondary | ICD-10-CM | POA: Diagnosis not present

## 2022-01-25 MED ORDER — CEPHALEXIN 500 MG PO CAPS: 1000.0000 mg | ORAL_CAPSULE | Freq: Two times a day (BID) | ORAL | 0 refills | Status: DC

## 2022-01-25 NOTE — ED Triage Notes (Signed)
Patient presents to Urgent Care with complaints of left ankle pain since 1 day ago. Patient reports golf club accidentally hitting his left ankle. He did not look at the ankle..having swelling and pain with bearing weight. Did see blood in bed this morning.

## 2022-01-25 NOTE — Discharge Instructions (Addendum)
Advised patient to leave skin avulsion covered for the next 24 hours then leave uncovered to heal by secondary intention/formed scab.  Advised patient to take medication as directed with food to completion.  Encouraged patient to increase daily water intake while taking this medication.  Advised patient to RICE left ankle for 20 to 25 minutes 3 times daily for the next 3 days.  Advised if symptoms worsen and/or unresolved please follow-up with PCP or here for further evaluation.

## 2022-01-25 NOTE — ED Provider Notes (Signed)
John Steele CARE    CSN: [id number] Arrival date & time: 01/25/22  0804      History   Chief Complaint Chief Complaint  Patient presents with   Ankle Pain    Left ankle    HPI John Steele is a 41 y.o. male.   HPI Pleasant 41 year-old male presents with left ankle pain for 1 day.  Reports golf club accidentally hit his ankle.  Patient reports never took his sock off and noticed blood from left ankle this morning.  PMH significant for testicular hypofunction, and OSA.  History reviewed. No pertinent past medical history.  Patient Active Problem List   Diagnosis Date Noted   OSA (obstructive sleep apnea) 03/15/2018   Lumbar degenerative disc disease 02/27/2014   Family history of prostate cancer 09/13/2013   Hemorrhoids 10/22/2011   OTHER TESTICULAR HYPOFUNCTION 01/05/2008    Past Surgical History:  Procedure Laterality Date   NO PAST SURGERIES         Home Medications    Prior to Admission medications   Medication Sig Start Date End Date Taking? Authorizing Provider  cephALEXin (KEFLEX) 500 MG capsule Take 2 capsules (1,000 mg total) by mouth 2 (two) times daily. 01/25/22  Yes Trevor Iha, FNP    Family History Family History  Problem Relation Age of Onset   Hypertension Mother    Diabetes Father    Prostate cancer Father 65    Social History Social History   Tobacco Use   Smoking status: Former    Types: Cigars   Smokeless tobacco: Never  Substance Use Topics   Alcohol use: Yes    Alcohol/week: 6.0 standard drinks of alcohol    Types: 6 Cans of beer per week     Allergies   Patient has no known allergies.   Review of Systems Review of Systems  Musculoskeletal:        Left ankle pain x1 day  Skin:  Positive for wound.     Physical Exam Triage Vital Signs ED Triage Vitals  Enc Vitals Group     BP 01/25/22 0820 (!) 158/107     Pulse Rate 01/25/22 0820 74     Resp 01/25/22 0820 18     Temp 01/25/22 0820 97.9 F (36.6 C)      Temp src --      SpO2 01/25/22 0820 97 %     Weight --      Height --      Head Circumference --      Peak Flow --      Pain Score 01/25/22 0818 7     Pain Loc --      Pain Edu? --      Excl. in GC? --    No data found.  Updated Vital Signs BP (!) 158/107 (BP Location: Left Arm)   Pulse 74   Temp 97.9 F (36.6 C)   Resp 18   SpO2 97%     Physical Exam Vitals and nursing note reviewed.  Constitutional:      General: He is not in acute distress.    Appearance: Normal appearance. He is not ill-appearing.  HENT:     Head: Normocephalic and atraumatic.     Mouth/Throat:     Mouth: Mucous membranes are moist.     Pharynx: Oropharynx is clear.  Eyes:     Extraocular Movements: Extraocular movements intact.     Conjunctiva/sclera: Conjunctivae normal.     Pupils: Pupils  are equal, round, and reactive to light.  Cardiovascular:     Rate and Rhythm: Normal rate and regular rhythm.     Pulses: Normal pulses.     Heart sounds: Normal heart sounds. No murmur heard. Pulmonary:     Effort: Pulmonary effort is normal.     Breath sounds: Normal breath sounds. No wheezing, rhonchi or rales.  Musculoskeletal:     Cervical back: Normal range of motion and neck supple.     Comments: Left ankle (lateral malleolus): TTP with mild to moderate soft tissue swelling noted, LROM with dorsiflexion/plantarflexion  Skin:    General: Skin is warm.     Comments: Left ankle (lateral malleolus): 3.5 cm x 0.5 cm open skin avulsion noted, nonindurated, nonfluctuant, no lymphatic streaking noted  Neurological:     General: No focal deficit present.     Mental Status: He is alert and oriented to person, place, and time. Mental status is at baseline.      UC Treatments / Results  Labs (all labs ordered are listed, but only abnormal results are displayed) Labs Reviewed - No data to display  EKG   Radiology DG Ankle Complete Left  Result Date: 01/25/2022 CLINICAL DATA:  41 year old  male with history of left ankle pain. EXAM: LEFT ANKLE COMPLETE - 3+ VIEW COMPARISON:  No priors. FINDINGS: Three views of the left ankle demonstrate no acute displaced fracture, subluxation or dislocation. Small tibiotalar joint effusion. Soft tissue swelling overlying the lateral malleolus. IMPRESSION: 1. No acute osseous abnormality. 2. Soft tissue swelling overlying the lateral malleolus and small tibiotalar joint effusion. Electronically Signed   By: Trudie Reed M.D.   On: 01/25/2022 08:58    Procedures Procedures (including critical care time)  Medications Ordered in UC Medications - No data to display  Initial Impression / Assessment and Plan / UC Course  I have reviewed the triage vital signs and the nursing notes.  Pertinent labs & imaging results that were available during my care of the patient were reviewed by me and considered in my medical decision making (see chart for details).     MDM: 1.  Left ankle pain-left ankle x-ray revealed above; 2.  Skin avulsion of left lower leg, initial encounter, Rx'd Keflex (wound cleaned irrigated and covered with large Band-Aid). Advised patient to leave skin avulsion covered for the next 24 hours then leave uncovered to heal by secondary intention/formed scab.  Advised patient to take medication as directed with food to completion.  Encouraged patient to increase daily water intake while taking this medication.  Advised patient to RICE left ankle for 20 to 25 minutes 3 times daily for the next 3 days.  Advised if symptoms worsen and/or unresolved please follow-up with PCP or here for further evaluation.  Patient discharged home, hemodynamically stable. Final Clinical Impressions(s) / UC Diagnoses   Final diagnoses:  Acute left ankle pain  Avulsion of skin of left lower leg, initial encounter     Discharge Instructions      Advised patient to leave skin avulsion covered for the next 24 hours then leave uncovered to heal by secondary  intention/formed scab.  Advised patient to take medication as directed with food to completion.  Encouraged patient to increase daily water intake while taking this medication.  Advised patient to RICE left ankle for 20 to 25 minutes 3 times daily for the next 3 days.  Advised if symptoms worsen and/or unresolved please follow-up with PCP or here for further evaluation.  ED Prescriptions     Medication Sig Dispense Auth. Provider   cephALEXin (KEFLEX) 500 MG capsule Take 2 capsules (1,000 mg total) by mouth 2 (two) times daily. 20 capsule Trevor Iha, FNP      PDMP not reviewed this encounter.   Trevor Iha, FNP 01/25/22 [phone]

## 2022-01-26 ENCOUNTER — Telehealth: Payer: Self-pay | Admitting: Emergency Medicine

## 2022-01-26 NOTE — Telephone Encounter (Signed)
Spoke with patient states that he is doing well, no issues or concerns.  Will follow up as needed.

## 2022-03-12 ENCOUNTER — Ambulatory Visit (INDEPENDENT_AMBULATORY_CARE_PROVIDER_SITE_OTHER): Payer: BC Managed Care – PPO | Admitting: Family Medicine

## 2022-03-12 ENCOUNTER — Encounter: Payer: Self-pay | Admitting: Family Medicine

## 2022-03-12 VITALS — BP 136/89 | HR 74 | Wt 200.0 lb

## 2022-03-12 DIAGNOSIS — Z0000 Encounter for general adult medical examination without abnormal findings: Secondary | ICD-10-CM

## 2022-03-12 DIAGNOSIS — R030 Elevated blood-pressure reading, without diagnosis of hypertension: Secondary | ICD-10-CM | POA: Diagnosis not present

## 2022-03-12 DIAGNOSIS — Z8042 Family history of malignant neoplasm of prostate: Secondary | ICD-10-CM | POA: Diagnosis not present

## 2022-03-12 NOTE — Progress Notes (Signed)
Complete physical exam  Patient: John Steele   DOB: 19-May-1981   41 y.o. Male  MRN: [id number]  Subjective:    Chief Complaint  Patient presents with   Annual Exam    John Steele is a 41 y.o. male who presents today for a complete physical exam. He reports consuming a general diet.  He has been doing weight, playing golf and basketball.   He generally feels well. He reports sleeping well. He does not have additional problems to discuss today.    Most recent fall risk assessment:    03/12/2022    8:19 AM  Fall Risk   Falls in the past year? 0  Number falls in past yr: 0  Injury with Fall? 0  Risk for fall due to : No Fall Risks  Follow up Falls evaluation completed     Most recent depression screenings:    03/12/2022    8:19 AM 03/07/2021    2:18 PM  PHQ 2/9 Scores  PHQ - 2 Score 0 0        Patient Care Team: Agapito Games, MD as PCP - General (Family Medicine)   Outpatient Medications Prior to Visit  Medication Sig   cephALEXin (KEFLEX) 500 MG capsule Take 2 capsules (1,000 mg total) by mouth 2 (two) times daily.   No facility-administered medications prior to visit.    ROS        Objective:     BP 136/89   Pulse 74   Wt 200 lb (90.7 kg)   SpO2 99%   BMI 27.89 kg/m    Physical Exam Constitutional:      Appearance: He is well-developed.  HENT:     Head: Normocephalic and atraumatic.     Right Ear: Tympanic membrane, ear canal and external ear normal.     Left Ear: Tympanic membrane, ear canal and external ear normal.     Nose: Nose normal.     Mouth/Throat:     Pharynx: Oropharynx is clear.  Eyes:     Conjunctiva/sclera: Conjunctivae normal.     Pupils: Pupils are equal, round, and reactive to light.  Neck:     Thyroid: No thyromegaly.  Cardiovascular:     Rate and Rhythm: Normal rate and regular rhythm.     Heart sounds: Normal heart sounds.  Pulmonary:     Effort: Pulmonary effort is normal.     Breath sounds: Normal  breath sounds.  Abdominal:     General: Bowel sounds are normal. There is no distension.     Palpations: Abdomen is soft. There is no mass.     Tenderness: There is no abdominal tenderness. There is no guarding or rebound.  Musculoskeletal:        General: Normal range of motion.     Cervical back: Normal range of motion and neck supple. No tenderness.  Lymphadenopathy:     Cervical: No cervical adenopathy.  Skin:    General: Skin is warm and dry.  Neurological:     Mental Status: He is alert and oriented to person, place, and time.     Deep Tendon Reflexes: Reflexes are normal and symmetric.  Psychiatric:        Behavior: Behavior normal.        Thought Content: Thought content normal.        Judgment: Judgment normal.      No results found for any visits on 03/12/22.     Assessment & Plan:  Routine Health Maintenance and Physical Exam  Immunization History  Administered Date(s) Administered   PFIZER(Purple Top)SARS-COV-2 Vaccination 09/28/2019   Tdap 08/31/2012    Health Maintenance  Topic Date Due   COVID-19 Vaccine (2 - Pfizer series) 03/28/2022 (Originally 11/23/2019)   INFLUENZA VACCINE  09/27/2022 (Originally 01/27/2022)   TETANUS/TDAP  09/01/2022   Hepatitis C Screening  Completed   HIV Screening  Completed   Pneumococcal Vaccine 60-34 Years old  Aged Out   HPV VACCINES  Aged Out    Discussed health benefits of physical activity, and encouraged him to engage in regular exercise appropriate for his age and condition.  Problem List Items Addressed This Visit       Other   Family history of prostate cancer   Relevant Orders   COMPLETE METABOLIC PANEL WITH GFR   Lipid panel   PSA   Elevated BP without diagnosis of hypertension   Other Visit Diagnoses     Wellness examination    -  Primary   Relevant Orders   COMPLETE METABOLIC PANEL WITH GFR   Lipid panel   PSA      Keep up a regular exercise program and make sure you are eating a healthy  diet Try to eat 4 servings of dairy a day, or if you are lactose intolerant take a calcium with vitamin D daily.  Your vaccines are up to date.    Elevated blood pressure-repeat pressure today was improved but still not at goal.  Encouraged him to check it at home if he has a blood pressure cuff available to see if blood pressure is typically under 130/85.  Marland Kitchen No follow-ups on file.     [name], MD

## 2022-03-13 LAB — LIPID PANEL
Cholesterol: 193 mg/dL (ref <200)
HDL: 58 mg/dL (ref >=40)
LDL Cholesterol (Calc): 115 mg/dL (calc) — ABNORMAL HIGH
Non-HDL Cholesterol (Calc): 135 mg/dL (calc) — ABNORMAL HIGH (ref <130)
Total CHOL/HDL Ratio: 3.3 (calc) (ref <5.0)
Triglycerides: 96 mg/dL (ref <150)

## 2022-03-13 LAB — COMPLETE METABOLIC PANEL WITH GFR
AG Ratio: 1.5 (calc) (ref 1.0–2.5)
ALT: 20 U/L (ref 9–46)
AST: 19 U/L (ref 10–40)
Albumin: 4.8 g/dL (ref 3.6–5.1)
Alkaline phosphatase (APISO): 63 U/L (ref 36–130)
BUN/Creatinine Ratio: 14 (calc) (ref 6–22)
BUN: 18 mg/dL (ref 7–25)
CO2: 29 mmol/L (ref 20–32)
Calcium: 10.1 mg/dL (ref 8.6–10.3)
Chloride: 105 mmol/L (ref 98–110)
Creat: 1.27 mg/dL — ABNORMAL HIGH (ref 0.60–1.26)
Globulin: 3.1 g/dL (calc) (ref 1.9–3.7)
Glucose, Bld: 92 mg/dL (ref 65–99)
Potassium: 5.2 mmol/L (ref 3.5–5.3)
Sodium: 142 mmol/L (ref 135–146)
Total Bilirubin: 0.6 mg/dL (ref 0.2–1.2)
Total Protein: 7.9 g/dL (ref 6.1–8.1)
eGFR: 74 mL/min/{1.73_m2} (ref >=60)

## 2022-03-13 LAB — PSA: PSA: 0.89 ng/mL (ref <=4.00)

## 2022-03-13 NOTE — Progress Notes (Signed)
Hi John Steele, your kidney function is a little higher than we normally see it is at 1.2 normally you are around 1.0.  Are you taking any preworkout supplement that may have a product called creatine in it?  Sometimes this can falsely elevate the serum creatinine.  LDL cholesterol looks better this year compared to last year so great work and bringing that back down.  Prostate blood test is normal.

## 2022-04-21 DIAGNOSIS — L910 Hypertrophic scar: Secondary | ICD-10-CM | POA: Diagnosis not present

## 2022-06-11 DIAGNOSIS — F439 Reaction to severe stress, unspecified: Secondary | ICD-10-CM | POA: Diagnosis not present

## 2022-06-19 ENCOUNTER — Other Ambulatory Visit: Payer: Self-pay | Admitting: Sports Medicine

## 2022-06-19 MED ORDER — ONDANSETRON 8 MG PO TBDP: 8.0000 mg | ORAL_TABLET | Freq: Three times a day (TID) | ORAL | 3 refills | Status: DC | PRN

## 2022-06-19 MED ORDER — OSELTAMIVIR PHOSPHATE 75 MG PO CAPS: 75.0000 mg | ORAL_CAPSULE | Freq: Every day | ORAL | 0 refills | Status: DC

## 2022-07-21 DIAGNOSIS — L910 Hypertrophic scar: Secondary | ICD-10-CM | POA: Diagnosis not present

## 2022-07-21 DIAGNOSIS — L730 Acne keloid: Secondary | ICD-10-CM | POA: Diagnosis not present

## 2022-07-22 DIAGNOSIS — F439 Reaction to severe stress, unspecified: Secondary | ICD-10-CM | POA: Diagnosis not present

## 2022-08-03 DIAGNOSIS — F439 Reaction to severe stress, unspecified: Secondary | ICD-10-CM | POA: Diagnosis not present

## 2022-08-20 DIAGNOSIS — F439 Reaction to severe stress, unspecified: Secondary | ICD-10-CM | POA: Diagnosis not present

## 2023-03-31 DIAGNOSIS — L910 Hypertrophic scar: Secondary | ICD-10-CM | POA: Diagnosis not present

## 2023-07-08 ENCOUNTER — Encounter: Payer: BC Managed Care – PPO | Admitting: Family Medicine

## 2023-07-20 ENCOUNTER — Encounter: Payer: BC Managed Care – PPO | Admitting: Family Medicine

## 2023-07-27 ENCOUNTER — Encounter: Payer: Self-pay | Admitting: Family Medicine

## 2023-07-27 ENCOUNTER — Ambulatory Visit: Payer: BC Managed Care – PPO

## 2023-07-27 ENCOUNTER — Ambulatory Visit (INDEPENDENT_AMBULATORY_CARE_PROVIDER_SITE_OTHER): Payer: BC Managed Care – PPO | Admitting: Family Medicine

## 2023-07-27 ENCOUNTER — Other Ambulatory Visit: Payer: Self-pay | Admitting: Family Medicine

## 2023-07-27 DIAGNOSIS — Z8042 Family history of malignant neoplasm of prostate: Secondary | ICD-10-CM

## 2023-07-27 DIAGNOSIS — Z23 Encounter for immunization: Secondary | ICD-10-CM

## 2023-07-27 DIAGNOSIS — Z125 Encounter for screening for malignant neoplasm of prostate: Secondary | ICD-10-CM

## 2023-07-27 DIAGNOSIS — N433 Hydrocele, unspecified: Secondary | ICD-10-CM | POA: Diagnosis not present

## 2023-07-27 DIAGNOSIS — Z0000 Encounter for general adult medical examination without abnormal findings: Secondary | ICD-10-CM

## 2023-07-27 DIAGNOSIS — N509 Disorder of male genital organs, unspecified: Secondary | ICD-10-CM

## 2023-07-27 DIAGNOSIS — N451 Epididymitis: Secondary | ICD-10-CM | POA: Diagnosis not present

## 2023-07-27 DIAGNOSIS — N5089 Other specified disorders of the male genital organs: Secondary | ICD-10-CM | POA: Diagnosis not present

## 2023-07-27 NOTE — Telephone Encounter (Signed)
Orders Placed This Encounter  Procedures  . Ambulatory referral to Urology    Referral Priority:   Routine    Referral Type:   Consultation    Referral Reason:   Specialty Services Required    Requested Specialty:   Urology    Number of Visits Requested:   1

## 2023-07-27 NOTE — Progress Notes (Signed)
Hi John Steele, on the left side they did see an approximately 1 cm hypoechoic lesion.  It does not seem to have blood flow to it so it is unlikely to be any type of true growth or tumor.  It may be some form of a cyst.  They said they could not quite say exactly what it was.  So would like to get you in with a urologist.  If you are okay with that please let us know if you have a preference for provider or location.

## 2023-07-27 NOTE — Progress Notes (Signed)
Complete physical exam  Patient: John Steele   DOB: Oct 17, 1980   43 y.o. Male  MRN: [id number]  Subjective:    Chief Complaint  Patient presents with   Annual Exam    John Steele is a 43 y.o. male who presents today for a complete physical exam. He reports consuming a general diet.  Plays bball 2 x per week and does resistance training.   He generally feels well. He reports sleeping well. He does have additional problems to discuss today.   C/O of pea sized knot on the left testicle. Noticed it about 3 wk ago after a groin strain. No change and not painful but still there.  No hx of testicular issue. Has had vasectomy about 5-7 yr ago.    Most recent fall risk assessment:    07/27/2023   10:02 AM  Fall Risk   Falls in the past year? 0  Number falls in past yr: 0  Injury with Fall? 0  Risk for fall due to : No Fall Risks  Follow up Falls evaluation completed     Most recent depression screenings:    07/27/2023   10:00 AM 03/12/2022    8:19 AM  PHQ 2/9 Scores  PHQ - 2 Score 0 0         Patient Care Team: Agapito Games, MD as PCP - General (Family Medicine)   Outpatient Medications Prior to Visit  Medication Sig   [DISCONTINUED] cephALEXin (KEFLEX) 500 MG capsule Take 2 capsules (1,000 mg total) by mouth 2 (two) times daily.   [DISCONTINUED] ondansetron (ZOFRAN-ODT) 8 MG disintegrating tablet Take 1 tablet (8 mg total) by mouth every 8 (eight) hours as needed for nausea.   [DISCONTINUED] oseltamivir (TAMIFLU) 75 MG capsule Take 1 capsule (75 mg total) by mouth daily.   No facility-administered medications prior to visit.    ROS        Objective:     There were no vitals taken for this visit.    Physical Exam Vitals and nursing note reviewed.  Constitutional:      Appearance: Normal appearance.  HENT:     Head: Normocephalic and atraumatic.     Right Ear: Tympanic membrane, ear canal and external ear normal.     Left Ear: Tympanic  membrane, ear canal and external ear normal.     Nose: Nose normal.     Mouth/Throat:     Pharynx: Oropharynx is clear.  Eyes:     Extraocular Movements: Extraocular movements intact.     Conjunctiva/sclera: Conjunctivae normal.     Pupils: Pupils are equal, round, and reactive to light.  Neck:     Thyroid: No thyromegaly.  Cardiovascular:     Rate and Rhythm: Normal rate and regular rhythm.  Pulmonary:     Effort: Pulmonary effort is normal.     Breath sounds: Normal breath sounds.  Abdominal:     General: Bowel sounds are normal.     Palpations: Abdomen is soft.     Tenderness: There is no abdominal tenderness.  Musculoskeletal:        General: No swelling.     Cervical back: Neck supple.  Skin:    General: Skin is warm and dry.  Neurological:     Mental Status: He is alert and oriented to person, place, and time.  Psychiatric:        Mood and Affect: Mood normal.        Behavior: Behavior normal.  No results found for any visits on 07/27/23.      Assessment & Plan:    Routine Health Maintenance and Physical Exam  Immunization History  Administered Date(s) Administered   PFIZER(Purple Top)SARS-COV-2 Vaccination 09/28/2019   Tdap 08/31/2012, 07/27/2023    Health Maintenance  Topic Date Due   COVID-19 Vaccine (2 - 2024-25 season) 02/28/2023   INFLUENZA VACCINE  09/27/2023 (Originally 01/28/2023)   DTaP/Tdap/Td (3 - Td or Tdap) 07/26/2033   Hepatitis C Screening  Completed   HIV Screening  Completed   HPV VACCINES  Aged Out    Discussed health benefits of physical activity, and encouraged him to engage in regular exercise appropriate for his age and condition.  Problem List Items Addressed This Visit       Other   Family history of prostate cancer   Relevant Orders   PSA   Other Visit Diagnoses       Wellness examination    -  Primary   Relevant Orders   CMP14+EGFR   Lipid panel   CBC   PSA     Encounter for immunization       Relevant Orders    Tdap vaccine greater than or equal to 7yo IM (Completed)     Screening for malignant neoplasm of prostate       Relevant Orders   PSA     Testicular lesion           Keep up a regular exercise program and make sure you are eating a healthy diet Try to eat 4 servings of dairy a day, or if you are lactose intolerant take a calcium with vitamin D daily.  Your vaccines are up to date.   He describes a testicular lesion that is been present for 3 weeks with no change no prior history of undescended testicle.  Will get further workup with Doppler.  It may have been there for some time and he just recently found it but it could be new.  Will evaluate for testicular lesion also consider other causes such as varicocele etc.  Return in about 1 year (around 07/26/2024) for Wellness Exam.     [name], MD

## 2023-07-28 ENCOUNTER — Encounter: Payer: Self-pay | Admitting: Family Medicine

## 2023-07-28 LAB — PSA: Prostate Specific Ag, Serum: 1.1 ng/mL (ref 0.0–4.0)

## 2023-07-28 LAB — CBC
Hematocrit: 49.3 % (ref 37.5–51.0)
Hemoglobin: 16.8 g/dL (ref 13.0–17.7)
MCH: 29.9 pg (ref 26.6–33.0)
MCHC: 34.1 g/dL (ref 31.5–35.7)
MCV: 88 fL (ref 79–97)
Platelets: 292 10*3/uL (ref 150–450)
RBC: 5.61 x10E6/uL (ref 4.14–5.80)
RDW: 12.7 % (ref 11.6–15.4)
WBC: 8.6 10*3/uL (ref 3.4–10.8)

## 2023-07-28 LAB — LIPID PANEL
Chol/HDL Ratio: 3.4 {ratio} (ref 0.0–5.0)
Cholesterol, Total: 197 mg/dL (ref 100–199)
HDL: 58 mg/dL (ref >39)
LDL Chol Calc (NIH): 119 mg/dL — ABNORMAL HIGH (ref 0–99)
Triglycerides: 113 mg/dL (ref 0–149)
VLDL Cholesterol Cal: 20 mg/dL (ref 5–40)

## 2023-07-28 LAB — CMP14+EGFR
ALT: 52 [IU]/L — ABNORMAL HIGH (ref 0–44)
AST: 38 [IU]/L (ref 0–40)
Albumin: 4.9 g/dL (ref 4.1–5.1)
Alkaline Phosphatase: 88 [IU]/L (ref 44–121)
BUN/Creatinine Ratio: 13 (ref 9–20)
BUN: 14 mg/dL (ref 6–24)
Bilirubin Total: 0.8 mg/dL (ref 0.0–1.2)
CO2: 23 mmol/L (ref 20–29)
Calcium: 10 mg/dL (ref 8.7–10.2)
Chloride: 99 mmol/L (ref 96–106)
Creatinine, Ser: 1.07 mg/dL (ref 0.76–1.27)
Globulin, Total: 3.3 g/dL (ref 1.5–4.5)
Glucose: 88 mg/dL (ref 70–99)
Potassium: 4.4 mmol/L (ref 3.5–5.2)
Sodium: 141 mmol/L (ref 134–144)
Total Protein: 8.2 g/dL (ref 6.0–8.5)
eGFR: 89 mL/min/{1.73_m2} (ref >59)

## 2023-07-28 NOTE — Progress Notes (Signed)
John Steele, the ALT liver enzyme is just slightly elevated it bumped up about 3 years ago and then went back to normal.  So not sure what is triggering it again but I do want a recheck that again in about a month or 2 just to make sure that it is going back down.  Kidney function looks good.  LDL cholesterol is up a little bit so just continue to work on healthy diet and regular exercise.  Blood count is normal no sign of anemia.  Prostate test is normal.

## 2023-07-29 ENCOUNTER — Encounter: Payer: Self-pay | Admitting: Family Medicine

## 2023-07-29 ENCOUNTER — Ambulatory Visit: Payer: BC Managed Care – PPO | Admitting: Family Medicine

## 2023-07-29 VITALS — BP 135/81 | HR 100 | Temp 99.1°F | Ht 70.0 in | Wt 208.0 lb

## 2023-07-29 DIAGNOSIS — J029 Acute pharyngitis, unspecified: Secondary | ICD-10-CM | POA: Diagnosis not present

## 2023-07-29 DIAGNOSIS — R509 Fever, unspecified: Secondary | ICD-10-CM

## 2023-07-29 LAB — POCT INFLUENZA A/B
Influenza A, POC: NEGATIVE
Influenza B, POC: NEGATIVE

## 2023-07-29 LAB — POC COVID19 BINAXNOW: SARS Coronavirus 2 Ag: NEGATIVE

## 2023-07-29 LAB — POCT RAPID STREP A (OFFICE): Rapid Strep A Screen: NEGATIVE

## 2023-07-29 NOTE — Progress Notes (Signed)
   Acute Office Visit  Subjective:     Patient ID: John Steele, male    DOB: [birth year], 43 y.o.   MRN: [id number]  Chief Complaint  Patient presents with   Sore Throat   Fever    HPI Patient is in today for ST that started 2 days ago he says it started initially which is what felt like a little scratchy throat and then just gradually got worse it feels pretty sore now.  He started coughing and feels like he is getting a little bit of congestion in his chest.  He has not really had any nasal congestion or runny nose.  He has been running a low-grade fever.  No GI symptoms.  Positive sick contacts at work. ROS      Objective:    BP 135/81   Pulse 100   Temp 99.1 F (37.3 C)   Ht 5\' 10"  (1.778 m)   Wt 208 lb (94.3 kg)   SpO2 96%   BMI 29.84 kg/m    Physical Exam Constitutional:      Appearance: Normal appearance.  HENT:     Head: Normocephalic and atraumatic.     Right Ear: Tympanic membrane, ear canal and external ear normal. There is no impacted cerumen.     Left Ear: Tympanic membrane, ear canal and external ear normal. There is no impacted cerumen.     Nose: Nose normal.     Mouth/Throat:     Pharynx: Oropharynx is clear. Posterior oropharyngeal erythema present.     Comments: Mild eryhtema.  Eyes:     Conjunctiva/sclera: Conjunctivae normal.  Cardiovascular:     Rate and Rhythm: Normal rate and regular rhythm.  Pulmonary:     Effort: Pulmonary effort is normal.     Breath sounds: Normal breath sounds.  Musculoskeletal:     Cervical back: Neck supple. No tenderness.  Lymphadenopathy:     Cervical: No cervical adenopathy.  Skin:    General: Skin is warm and dry.  Neurological:     Mental Status: He is alert and oriented to person, place, and time.  Psychiatric:        Mood and Affect: Mood normal.     Results for orders placed or performed in visit on 07/29/23  POCT Influenza A/B  Result Value Ref Range   Influenza A, POC Negative Negative    Influenza B, POC Negative Negative  POCT rapid strep A  Result Value Ref Range   Rapid Strep A Screen Negative Negative  POC COVID-19  Result Value Ref Range   SARS Coronavirus 2 Ag Negative Negative        Assessment & Plan:   Problem List Items Addressed This Visit   None Visit Diagnoses       Fever, unspecified fever cause    -  Primary   Relevant Orders   POCT Influenza A/B (Completed)   POCT rapid strep A (Completed)   POC COVID-19 (Completed)     Sore throat       Relevant Orders   POCT Influenza A/B (Completed)   POCT rapid strep A (Completed)      Neg for COVID, flu and strep.  Is consistent with viral upper respiratory symptom.  If not improving after the weekend please let us know otherwise continue with symptomatic care.  Consider RSV  No orders of the defined types were placed in this encounter.   No follow-ups on file.  [name], MD

## 2023-07-29 NOTE — Progress Notes (Signed)
Hi John Steele.  I do think this is viral. You are negative for strep, Covid and flu.  I would recommend stay hydrated. You can use cold medicines and Ibuprofen for your sore throat.  I recommend getting some good rest.  If you are not better by Monday please let me know.  Do you need a work note to be out?

## 2023-08-17 ENCOUNTER — Encounter: Payer: Self-pay | Admitting: Family Medicine

## 2023-08-22 NOTE — Telephone Encounter (Signed)
 Ok fo rwork note for Dad

## 2023-08-23 ENCOUNTER — Encounter: Payer: Self-pay | Admitting: Family Medicine

## 2023-12-29 DIAGNOSIS — R21 Rash and other nonspecific skin eruption: Secondary | ICD-10-CM | POA: Diagnosis not present

## 2023-12-29 DIAGNOSIS — L910 Hypertrophic scar: Secondary | ICD-10-CM | POA: Diagnosis not present

## 2023-12-29 DIAGNOSIS — L720 Epidermal cyst: Secondary | ICD-10-CM | POA: Diagnosis not present

## 2024-02-29 ENCOUNTER — Encounter: Payer: Self-pay | Admitting: Sports Medicine

## 2024-03-29 DIAGNOSIS — L0100 Impetigo, unspecified: Secondary | ICD-10-CM | POA: Diagnosis not present

## 2024-03-29 DIAGNOSIS — L720 Epidermal cyst: Secondary | ICD-10-CM | POA: Diagnosis not present

## 2024-07-27 ENCOUNTER — Encounter: Payer: BC Managed Care – PPO | Admitting: Family Medicine

## 2024-07-27 DIAGNOSIS — Z0000 Encounter for general adult medical examination without abnormal findings: Secondary | ICD-10-CM

## 2024-07-27 NOTE — Progress Notes (Unsigned)
" ° °  Complete physical exam  Patient: John Steele    DOB: 08-18-1980 44 y.o.   MRN: [id number]  No chief complaint on file.   Subjective:    John Steele is a 44 y.o. male who presents today for a complete physical exam. He reports consuming a {diet types:17450} diet. {types:19826} He generally feels {DESC; WELL/FAIRLY WELL/POORLY:18703}. He reports sleeping {DESC; WELL/FAIRLY WELL/POORLY:18703}. He {does/does not:200015} have additional problems to discuss today.   Discussed the use of AI scribe software for clinical note transcription with the patient, who gave verbal consent to proceed.  History of Present Illness    Most recent fall risk assessment:    07/27/2023   10:02 AM  Fall Risk   Falls in the past year? 0  Number falls in past yr: 0  Injury with Fall? 0   Risk for fall due to : No Fall Risks  Follow up Falls evaluation completed     Data saved with a previous flowsheet row definition     Most recent depression screenings:    07/27/2023   10:00 AM 03/12/2022    8:19 AM  PHQ 2/9 Scores  PHQ - 2 Score 0 0    {VISON DENTAL STD PSA (Optional):27386} {History (Optional):23778}  Patient Care Team: Alvan Dorothyann BIRCH, MD as PCP - General (Family Medicine)   ROS    Objective:    There were no vitals taken for this visit. {Vitals History (Optional):23777}  Physical Exam  {PhysExam [facility] (Optional):210964309}  No results found for any visits on 07/27/24. {Show previous labs (optional):23779}     Assessment & Plan:    Routine Health Maintenance and Physical Exam Immunization History  Administered Date(s) Administered   PFIZER(Purple Top)SARS-COV-2 Vaccination 09/28/2019   Tdap 08/31/2012, 07/27/2023    Health Maintenance  Topic Date Due   Hepatitis B Vaccines 19-59 Average Risk (1 of 3 - 19+ 3-dose series) Never done   Influenza Vaccine  Never done   COVID-19 Vaccine (2 - 2025-26 season) 02/28/2024   DTaP/Tdap/Td (3 - Td or Tdap)  07/26/2033   HPV VACCINES (No Doses Required) Completed   Hepatitis C Screening  Completed   HIV Screening  Completed   Pneumococcal Vaccine  Aged Out   Meningococcal B Vaccine  Aged Out    Discussed health benefits of physical activity, and encouraged him to engage in regular exercise appropriate for his age and condition.  Problem List Items Addressed This Visit   None Visit Diagnoses       Routine general medical examination at a health care facility    -  Primary       Assessment and Plan Assessment & Plan     No follow-ups on file.    Dorothyann Alvan, MD [facility] at [facility]    "
